# Patient Record
Sex: Female | Born: 1960 | Race: White | Hispanic: No | Marital: Married | Smoking: Never smoker
Health system: Southern US, Community
[De-identification: ages and names within clinical notes are randomized; demographics above are authoritative.]

## PROBLEM LIST (undated history)

## (undated) DIAGNOSIS — R7303 Prediabetes: Secondary | ICD-10-CM

## (undated) DIAGNOSIS — Z8041 Family history of malignant neoplasm of ovary: Secondary | ICD-10-CM

## (undated) DIAGNOSIS — Z90710 Acquired absence of both cervix and uterus: Secondary | ICD-10-CM

## (undated) DIAGNOSIS — R6882 Decreased libido: Secondary | ICD-10-CM

## (undated) DIAGNOSIS — M84374A Stress fracture, right foot, initial encounter for fracture: Secondary | ICD-10-CM

## (undated) DIAGNOSIS — B019 Varicella without complication: Secondary | ICD-10-CM

## (undated) DIAGNOSIS — N393 Stress incontinence (female) (male): Secondary | ICD-10-CM

## (undated) DIAGNOSIS — N764 Abscess of vulva: Secondary | ICD-10-CM

## (undated) DIAGNOSIS — E8940 Asymptomatic postprocedural ovarian failure: Secondary | ICD-10-CM

## (undated) HISTORY — DX: Decreased libido: R68.82

## (undated) HISTORY — PX: SHOULDER SURGERY: SHX246

## (undated) HISTORY — DX: Abscess of vulva: N76.4

## (undated) HISTORY — DX: Stress fracture, right foot, initial encounter for fracture: M84.374A

## (undated) HISTORY — PX: KNEE SURGERY: SHX244

## (undated) HISTORY — DX: Acquired absence of both cervix and uterus: Z90.710

## (undated) HISTORY — DX: Prediabetes: R73.03

## (undated) HISTORY — DX: Varicella without complication: B01.9

## (undated) HISTORY — DX: Stress incontinence (female) (male): N39.3

## (undated) HISTORY — DX: Asymptomatic postprocedural ovarian failure: E89.40

## (undated) HISTORY — PX: BILATERAL SALPINGOOPHORECTOMY: SHX1223

## (undated) HISTORY — DX: Family history of malignant neoplasm of ovary: Z80.41

---

## 1992-08-27 HISTORY — PX: ABDOMINAL HYSTERECTOMY: SHX81

## 2006-06-18 ENCOUNTER — Ambulatory Visit: Payer: Self-pay | Admitting: Unknown Physician Specialty

## 2006-08-29 ENCOUNTER — Ambulatory Visit: Payer: Self-pay | Admitting: Unknown Physician Specialty

## 2007-10-13 ENCOUNTER — Ambulatory Visit: Payer: Self-pay | Admitting: Chiropractic Medicine

## 2014-06-14 LAB — HM MAMMOGRAPHY

## 2014-09-10 DIAGNOSIS — N764 Abscess of vulva: Secondary | ICD-10-CM

## 2014-09-10 HISTORY — DX: Abscess of vulva: N76.4

## 2015-04-05 ENCOUNTER — Ambulatory Visit (INDEPENDENT_AMBULATORY_CARE_PROVIDER_SITE_OTHER): Payer: BC Managed Care – PPO | Admitting: Podiatry

## 2015-04-05 ENCOUNTER — Encounter: Payer: Self-pay | Admitting: Podiatry

## 2015-04-05 ENCOUNTER — Ambulatory Visit (INDEPENDENT_AMBULATORY_CARE_PROVIDER_SITE_OTHER): Payer: BC Managed Care – PPO

## 2015-04-05 VITALS — BP 107/68 | HR 66 | Resp 16

## 2015-04-05 DIAGNOSIS — M779 Enthesopathy, unspecified: Secondary | ICD-10-CM | POA: Diagnosis not present

## 2015-04-05 DIAGNOSIS — M79671 Pain in right foot: Secondary | ICD-10-CM

## 2015-04-05 DIAGNOSIS — M2580 Other specified joint disorders, unspecified joint: Secondary | ICD-10-CM

## 2015-04-05 NOTE — Progress Notes (Signed)
   Subjective:    Patient ID: Christy Conway, female    DOB: 09/06/1960, 54 y.o.   MRN: [id number]  HPI 54 year old female presents the assistance of the right foot pain in the ball of her feet been ongoing for the last several weeks. She denies a history of injury or trauma to the area. She denies any swelling and redness. She gets an occasional burning sensation to the toes after these on for quite some time. She is on the pain to the ball of her foot particularly in the first big toe joint. She also points the medial aspect of the first metatarsal head where she gets some discomfort.  She does have to walk on the outside portion of her foot to take pressure off the inside part of her foot. She has no pain at rest however she has pain after standing for quite some time.she's had no prior treatment. No other complaints at this time.   Review of Systems  All other systems reviewed and are negative.      Objective:   Physical Exam AAO x3, NAD DP/PT pulses palpable bilaterally, CRT less than 3 seconds Protective sensation intact with Simms Weinstein monofilament, vibratory sensation intact, Achilles tendon reflex intact There is tenderness to palpation on the medial band of the plantar fascia distally just proximal to the sesamoids. There is mild discomfort to palpation around the sesamoid complex as well. There is no pain the vibratory sensation. There is no pain with MTPJ range of motion. There is no palpable neuroma identified the interspaces. There is no reproduction numbness or tingling upon compression the interspaces or with meals lateral compression the metatarsals. There is no area pinpoint bony tenderness or pain the vibratory sensation of bilateral lower extremity. Hammertoe contractures are present. MMT 5/5, ROM WNL.  No open lesions or pre-ulcerative lesions.  No overlying edema, erythema, increase in warmth to bilateral lower extremities.  No pain with calf compression, swelling,  warmth, erythema bilaterally.      Assessment & Plan:  54 year old female with capsulitis sesamoiditis left foot, tendinitis -X-rays were obtained and reviewed with the patient. There does appear to be some chronic changes of the fibular sesamoid. However there is no history of injury, so fracture and acute sort is unlikely. -Treatment options discussed including all alternatives, risks, and complications -Discussed likely etiology of her symptoms. -I Discussed steroid injection from the sesamoid complex and just proximal to the area of maximal tenderness. Discussed risks complications the injection for which she understands improvement in symptoms. Under sterile conditions a total of 1 mL mixture of dexamethasone phosphate and 0.5% Marcaine plain was infiltrated just proximal to the sesamoid complex the area of maximal tenderness. Bandage was applied. She tolerated the injection well without any complications. Post injection care was discussed the patient. -Dispensed offloading pads. -Continue anti-inflammatories. She does get her prescription filled for ibuprofen 800 mg. -Follow-up 2-3 weeks or sooner if any problems arise. In the meantime, encouraged to call the office with any questions, concerns, change in symptoms.   Ovid Curd, DPM

## 2015-04-12 ENCOUNTER — Encounter: Payer: Self-pay | Admitting: Obstetrics and Gynecology

## 2015-04-12 ENCOUNTER — Ambulatory Visit (INDEPENDENT_AMBULATORY_CARE_PROVIDER_SITE_OTHER): Payer: BC Managed Care – PPO | Admitting: Obstetrics and Gynecology

## 2015-04-12 VITALS — BP 113/70 | HR 67 | Ht 64.0 in | Wt 133.4 lb

## 2015-04-12 DIAGNOSIS — E8940 Asymptomatic postprocedural ovarian failure: Secondary | ICD-10-CM | POA: Insufficient documentation

## 2015-04-12 DIAGNOSIS — N898 Other specified noninflammatory disorders of vagina: Secondary | ICD-10-CM

## 2015-04-12 MED ORDER — ESTRADIOL 0.1 MG/GM VA CREA: TOPICAL_CREAM | VAGINAL | Status: DC

## 2015-04-12 MED ORDER — FLUCONAZOLE 150 MG PO TABS: 150.0000 mg | ORAL_TABLET | Freq: Once | ORAL | Status: DC

## 2015-04-12 NOTE — Patient Instructions (Signed)
1.  Diflucan 150 mg by mouth 2.  Estrace cream 1/2-1 g intravaginally biweekly 3.  Return in 2 months for follow-up

## 2015-04-12 NOTE — Progress Notes (Signed)
Patient ID: Christy Conway, female   DOB: [birth year], 54 y.o.   MRN: [id number] Vaginal dryness getting worse this last 2 months No estrogen cream used  Chief complaint: 1.  Vaginal dryness  54 year old white female with history of surgical menopause, on Estratest, without vasomotor symptoms, presents for evaluation of worsening vaginal dryness over the past 2 months.  She denies vaginal itching; she has had a white mucoid discharge.  No recent antibiotic therapy.  OBJECTIVE: BP 113/70 mmHg  Pulse 67  Ht  (1.626 m)  Wt 133 lb 6.4 oz (60.51 kg)  BMI 22.89 kg/m2  LMP  (LMP Unknown) Pleasant, well-appearing white female in no acute distress. Abdomen: Soft, nontender. Pelvic exam:  External genitalia: Normal   BUS: Normal.  Vagina: Good vaginal vault support; no lesions; copious white mucoid discharge present.  Cervix: Surgically absent   Uterus: Surgically absent  Bimanual exam: Deferred.  IMPRESSION: 1.  Recent onset worsening vaginal dryness, unclear etiology. 2.  Vagina with normal estrogen effect. 3.  White mucoid discharge  PLAN: 1.  Nu swab for Candida and BV 2.  Diflucan 150 mg by mouth 3.  Begin Estrace cream 1/2-1 g intravaginal biweekly. 4.  Return in 2 months for follow-up.  A total of 15 minutes were spent face-to-face with the patient during this encounter and over half of that time dealt with counseling and coordination of care.

## 2015-04-16 LAB — NUSWAB BV AND CANDIDA, NAA
Candida albicans, NAA: NEGATIVE
Candida glabrata, NAA: NEGATIVE

## 2015-04-19 ENCOUNTER — Ambulatory Visit (INDEPENDENT_AMBULATORY_CARE_PROVIDER_SITE_OTHER): Payer: BC Managed Care – PPO | Admitting: Podiatry

## 2015-04-19 ENCOUNTER — Encounter: Payer: Self-pay | Admitting: Podiatry

## 2015-04-19 ENCOUNTER — Ambulatory Visit (INDEPENDENT_AMBULATORY_CARE_PROVIDER_SITE_OTHER): Payer: BC Managed Care – PPO

## 2015-04-19 VITALS — BP 105/74 | HR 72 | Resp 18

## 2015-04-19 DIAGNOSIS — M879 Osteonecrosis, unspecified: Secondary | ICD-10-CM | POA: Diagnosis not present

## 2015-04-19 DIAGNOSIS — M2580 Other specified joint disorders, unspecified joint: Secondary | ICD-10-CM | POA: Diagnosis not present

## 2015-04-19 DIAGNOSIS — M8700 Idiopathic aseptic necrosis of unspecified bone: Secondary | ICD-10-CM

## 2015-04-19 DIAGNOSIS — M216X1 Other acquired deformities of right foot: Secondary | ICD-10-CM | POA: Diagnosis not present

## 2015-04-20 NOTE — Progress Notes (Signed)
Patient ID: Christy Conway, female   DOB: May 21, 1961, 54 y.o.   MRN: [id number]  Subjective: 54 year old female presents the office for follow-up evaluation of right foot pain. She states that she can she have pain to her right foot in the ball of her foot. She says the injection did not help much and she's been taking ibuprofen. She states her pain is about the same. No other complaints at this time.  Objective: AAO x3, NAD DP/PT pulses palpable bilaterally, CRT less than 3 seconds Protective sensation intact with Simms Weinstein monofilament On the right foot there is tenderness palpation underlying the first interspace on the lateral sesamoid. There is also mild tenderness along submetatarsal 3. There is no tenderness on the dorsal aspect of the metatarsals or digits. Mild discomfort with first and third MPJ range of motion however there is no crepitation or limitation of motion. There is no overlying edema, erythema, increase in warmth. There is no palpable neuroma identified at this time. No other areas of tenderness to bilateral lower extremities. MMT 5/5, ROM WNL.  No open lesions or pre-ulcerative lesions.  No overlying edema, erythema, increase in warmth to bilateral lower extremities.  No pain with calf compression, swelling, warmth, erythema bilaterally.   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 There appears to be more fragmentation of the lateral sesamoid compared to prior x-ray. Also in the sesamoid axial the third metatarsal appears to be somewhat more plantar flexed compared to the other metatarsals. -Because the above findings of the sesamoid abdomen ordered an MRI to further evaluate this area. -She is unable to be immobilized that she is going back to work next week for which she works in [employer]. I dispensed a graphite insert to mobilize the  area help take pressure off the MPJs. Also dispensed metatarsal offloading pads. -Anti-inflammatories as needed. -Discussed to wear supportive shoe at all times. -Follow-up after MRI or sooner if any problems arise. In the meantime, encouraged to call the office with any questions, concerns, change in symptoms.   Ovid Curd, DPM

## 2015-04-26 ENCOUNTER — Telehealth: Payer: Self-pay | Admitting: *Deleted

## 2015-04-26 NOTE — Telephone Encounter (Signed)
Prior authorization received from [facility] at 117pm dx M87.9 vs M25.80 for MRI 40981, Prior Authorization# [id number] valid 05/25/2015.  Informed pt she had received prior authorization and could schedule her MRI.

## 2015-05-03 ENCOUNTER — Ambulatory Visit: Payer: BC Managed Care – PPO | Admitting: Podiatry

## 2015-05-04 ENCOUNTER — Ambulatory Visit
Admission: RE | Admit: 2015-05-04 | Discharge: 2015-05-04 | Disposition: A | Discharge status: Home / Self Care | Payer: BC Managed Care – PPO | Source: Ambulatory Visit | Attending: Podiatry | Admitting: Podiatry

## 2015-05-04 DIAGNOSIS — M25871 Other specified joint disorders, right ankle and foot: Secondary | ICD-10-CM | POA: Diagnosis present

## 2015-05-04 DIAGNOSIS — M25474 Effusion, right foot: Secondary | ICD-10-CM | POA: Diagnosis not present

## 2015-05-04 DIAGNOSIS — M879 Osteonecrosis, unspecified: Secondary | ICD-10-CM | POA: Insufficient documentation

## 2015-05-04 DIAGNOSIS — M8700 Idiopathic aseptic necrosis of unspecified bone: Secondary | ICD-10-CM

## 2015-05-04 DIAGNOSIS — M2580 Other specified joint disorders, unspecified joint: Secondary | ICD-10-CM

## 2015-05-05 ENCOUNTER — Telehealth: Payer: Self-pay | Admitting: *Deleted

## 2015-05-05 NOTE — Telephone Encounter (Addendum)
-----   Message from Vivi Barrack, DPM sent at 05/05/2015  8:09 AM EDT ----- Can we send her MRI out for an over read? She has sesamoid pain and I am concerned about AVN. Also, can you let her know we got the results but I am sending it out for a second opinion? Thanks. Faxed request for copy of MRI CD to [facility].  Left message pt's mobile to call our office for information concerning MRI results, left message on home phone that the MRI was sent out for a more in depth reading and we would call with results.

## 2015-05-12 ENCOUNTER — Encounter: Payer: Self-pay | Admitting: Podiatry

## 2015-05-12 ENCOUNTER — Ambulatory Visit (INDEPENDENT_AMBULATORY_CARE_PROVIDER_SITE_OTHER): Payer: BC Managed Care – PPO | Admitting: Podiatry

## 2015-05-12 VITALS — BP 121/76 | HR 61 | Resp 16

## 2015-05-12 DIAGNOSIS — M2580 Other specified joint disorders, unspecified joint: Secondary | ICD-10-CM | POA: Diagnosis not present

## 2015-05-12 NOTE — Progress Notes (Signed)
Patient ID: Christy Conway, female   DOB: 12/13/1960, 54 y.o.   MRN: [id number]  Subjective: 54 year old female presents the office a follow up evaluation of right foot pain, sesamoid pain. She presents today for MRI results. She states that she continues with the same pain is not changed. Anti-inflammatories and injections do not seem to do much. She denies any swelling or redness. No recent injury or trauma. No other complaints at this time in no acute changes otherwise.  Objective: AAO 3, NAD Neurovascular status intact and unchanged. Discontinuation of pain along the plantar aspect of the sesamoid on the lateral sesamoid. There is no pain with MPJ range of motion or along the metatarsals. There is no pain along the first interspace. There is no other areas of pinpoint bony tenderness or pain the vibratory sensation. There is no overlying edema, erythema, increase in warmth. No open lesions or pre-ulcerative lesions. There is no pain with calf compression, swelling, warmth, erythema.  Assessment: 54 year old female sesamoiditis lateral sesamoid right foot  Plan: -MRI results were discussed the patient. MRI did not reveal much as far as AVN or osteoarthritis images comments on sesamoiditis. I sent the  MRI for second opinion. -If there is concentric AVN will immobilize and possible bone sooner. If there is inflammation and osteoarthritis likely start Medrol Dosepak. Either way and discussed that she'll likely benefit from custom molded orthotics and she stands on her feet all day. For now continue offloading pad. I will contact her once he received the results of the over read. In the meantime call the office with any questions, concerns, changes symptoms.  Ovid Curd, DPM

## 2015-05-19 ENCOUNTER — Ambulatory Visit (INDEPENDENT_AMBULATORY_CARE_PROVIDER_SITE_OTHER): Payer: BC Managed Care – PPO | Admitting: Podiatry

## 2015-05-19 ENCOUNTER — Ambulatory Visit: Payer: BC Managed Care – PPO | Admitting: Podiatry

## 2015-05-19 ENCOUNTER — Encounter: Payer: Self-pay | Admitting: Podiatry

## 2015-05-19 VITALS — BP 123/58 | HR 62 | Resp 18

## 2015-05-19 DIAGNOSIS — T148 Other injury of unspecified body region: Secondary | ICD-10-CM | POA: Diagnosis not present

## 2015-05-19 DIAGNOSIS — IMO0002 Reserved for concepts with insufficient information to code with codable children: Secondary | ICD-10-CM

## 2015-05-19 DIAGNOSIS — M2580 Other specified joint disorders, unspecified joint: Secondary | ICD-10-CM

## 2015-05-20 NOTE — Progress Notes (Signed)
Patient ID: Christy Conway, female   DOB: February 11, 1961, 54 y.o.   MRN: [id number]  Subjective: 54 year old female presents the office a follow up evaluation of right foot pain, sesamoid pain. She presents today to discuss the MRI results. She states that she continues to have pain to the ball of her right foot and she occasionally gets some numbness and tingling to her second, third toes. She states that she had increased pain today after standing all day at work. She denies any swelling or redness. No recent injury or trauma. No other complaints at this time in no acute changes. She denies any systemic complaints such as fevers, chills, nausea, vomiting.  Objective: AAO 3, NAD Neurovascular status intact and unchanged. There is continued tenderness to palpation overlying the right foot on the both the medial and lateral sesamoid mostly of on the lateral sesamoid. There is mild edema overlying the area without any associated erythema or increase in warmth. There is no pain with MPJ range of motion. There is no tenderness of the digit or the metatarsal. There is mild discomfort upon palpation of the second interspace. There is subjective numbness and tingling to the second, third toes. No palpable neuromas identified. No other areas of tenderness to bilateral lower extremities. No other areas of edema, erythema, increased warmth. There is no pain with calf compression, swelling, warmth, erythema.  Assessment: 54 year old female lateral sesamoid fracture right foot, Morton's neuroma  Plan: -Treatment options discussed including all alternatives, risks, and complications -MRI results were discussed the patient. MRI does reveal fracture of the lateral sesamoid and inflammation within the tibial sesamoid. Also shows Morton's neuroma in the second interspace. -Due to the fracture and the continued pain recommend offloading. Dispensed surgical shoe with offloading pads to take pressure off the sesamoids. She  states that she cannot with so work and she must work. I did dispense offloading pads which were made for her to take pressure off the sesamoids. This may be a chronic fracture. She has no history of injury to the area this is been ongoing for quite some time. -Anti-inflammatories as needed for pain. -Ice and elevation. -Follow-up in 4 weeks or sooner if any problems arise. In the meantime, encouraged to call the office with any questions, concerns, change in symptoms.   Ovid Curd, DPM

## 2015-05-23 ENCOUNTER — Telehealth: Payer: Self-pay | Admitting: Podiatry

## 2015-05-23 NOTE — Telephone Encounter (Signed)
Patient called today saying that she is able to wear the boot/shoe during the day, and its helping with the pain. However, she wants to know if she takes a leave, would her being completely off her feet help this stress fracture to heal faster? Apparently she is unable to wear the shoe for the 6 hours that she works each day and wants to talk about other options. She is requesting a call back from the nurse, or Dr. Ardelle Anton himself. Please call patient at the [phone] as soon as possible. She already has an appointment scheduled for 05/24/15 but doesn't want to keep this appointment if he doesn't need to see her again.

## 2015-05-23 NOTE — Telephone Encounter (Signed)
I will have Victorino Dike call her. Thanks

## 2015-05-24 ENCOUNTER — Ambulatory Visit: Payer: BC Managed Care – PPO | Admitting: Podiatry

## 2015-05-24 ENCOUNTER — Telehealth: Payer: Self-pay

## 2015-05-24 NOTE — Telephone Encounter (Signed)
Pt had called 05/23/2015 asking if she would stay off of her foot would it heal sooner then if she kept working . Dr Ardelle Anton said he would approve FMLA paperwork if she would bring it in to the office. Aslo, there was no need for her to keep her appt for 05/24/2015 per Dr. Ardelle Anton.  Pt will bring in [facility] paperwork.

## 2015-05-25 DIAGNOSIS — M79673 Pain in unspecified foot: Secondary | ICD-10-CM

## 2015-05-27 ENCOUNTER — Encounter: Payer: Self-pay | Admitting: Podiatry

## 2015-05-30 ENCOUNTER — Telehealth: Payer: Self-pay | Admitting: Podiatry

## 2015-05-30 NOTE — Telephone Encounter (Signed)
Patient stopped by the office asking about the status of her FMLA / Disability paperwork. Please call her.

## 2015-06-02 ENCOUNTER — Encounter: Payer: Self-pay | Admitting: Podiatry

## 2015-06-08 NOTE — Telephone Encounter (Signed)
CALLED PATIENT ALREADY. Christy Conway

## 2015-06-08 NOTE — Telephone Encounter (Signed)
CALLED PATIENT ON 05/30/15 AND STATED THAT I WAS WORKING ON THE FMLA PAPERS AND IT WAS GOING TO BE Tuesday WHEN SHE COULD COME DUE TO ME (Arlita) BEING IN [place] ON Monday. Anselma

## 2015-06-16 ENCOUNTER — Encounter: Payer: Self-pay | Admitting: Podiatry

## 2015-06-16 ENCOUNTER — Ambulatory Visit: Payer: BC Managed Care – PPO | Admitting: Podiatry

## 2015-06-16 ENCOUNTER — Ambulatory Visit (INDEPENDENT_AMBULATORY_CARE_PROVIDER_SITE_OTHER): Payer: BC Managed Care – PPO

## 2015-06-16 ENCOUNTER — Ambulatory Visit (INDEPENDENT_AMBULATORY_CARE_PROVIDER_SITE_OTHER): Payer: BC Managed Care – PPO | Admitting: Podiatry

## 2015-06-16 VITALS — BP 110/70 | HR 76 | Resp 12

## 2015-06-16 DIAGNOSIS — IMO0002 Reserved for concepts with insufficient information to code with codable children: Secondary | ICD-10-CM

## 2015-06-16 DIAGNOSIS — T148 Other injury of unspecified body region: Secondary | ICD-10-CM | POA: Diagnosis not present

## 2015-06-16 NOTE — Progress Notes (Signed)
Patient ID: Christy DresserLisa K Conway, female   DOB: Sep 03, 1960, 54 y.o.   MRN: [id number]  Subjective: Patient presents the office in follow-up evaluation of left sesamoid fracture, neuroma. She's continue the surgical shoe. She states her pain is about the same although slightly improved. She does that she had a wedding for her son and she wore high-heeled shoe which increased her pain although since returning the surgical shoe she was doing better. She denies any swelling or redness around the area. No recent injury or trauma. No other complaints at this time in no acute changes.  Objective: AAO 3, NAD DP/PT pulses palpable 2/4, CRT less than 3 seconds Protective sensation intact with Simms Weinstein monofilament There is continued tenderness palpation of the lateral sesamoid on the plantar aspect of the left foot. There is no other specific area pinpoint bony tenderness or pain the vibratory sensation bilateral lower extremity. There is no overlying edema, erythema, increase in warmth bilaterally. There is no pain with MPJ range of motion.MMT 5/5, ROM WNL. No open lesions or pre-ulcerative lesions identified bilaterally. No pain with calf compression, swelling, warmth, erythema.  Assessment: 54 year old female right foot sesamoid fracture  Plan: -X-rays were obtained and reviewed with the patient.  -Treatment options discussed including all alternatives, risks, and complications -Recommend continued immobilization in the surgical shoe with offloading pads.  -Ice and elevation. -I discussed the possibility of a bone stimulator once her insurance approves this. We will discuss this and next appointment. *xray next appointment  Ovid CurdMatthew Mkenzie Dotts, DPM

## 2015-06-28 ENCOUNTER — Encounter: Payer: Self-pay | Admitting: Obstetrics and Gynecology

## 2015-06-28 ENCOUNTER — Ambulatory Visit (INDEPENDENT_AMBULATORY_CARE_PROVIDER_SITE_OTHER): Payer: BC Managed Care – PPO | Admitting: Obstetrics and Gynecology

## 2015-06-28 VITALS — BP 147/82 | HR 72 | Ht 64.0 in | Wt 132.2 lb

## 2015-06-28 DIAGNOSIS — N958 Other specified menopausal and perimenopausal disorders: Secondary | ICD-10-CM | POA: Diagnosis not present

## 2015-06-28 DIAGNOSIS — Z01419 Encounter for gynecological examination (general) (routine) without abnormal findings: Secondary | ICD-10-CM

## 2015-06-28 DIAGNOSIS — E8940 Asymptomatic postprocedural ovarian failure: Secondary | ICD-10-CM

## 2015-06-28 DIAGNOSIS — Z1211 Encounter for screening for malignant neoplasm of colon: Secondary | ICD-10-CM | POA: Diagnosis not present

## 2015-06-28 DIAGNOSIS — R6882 Decreased libido: Secondary | ICD-10-CM

## 2015-06-28 DIAGNOSIS — Z90710 Acquired absence of both cervix and uterus: Secondary | ICD-10-CM | POA: Insufficient documentation

## 2015-06-28 DIAGNOSIS — Z8041 Family history of malignant neoplasm of ovary: Secondary | ICD-10-CM | POA: Diagnosis not present

## 2015-06-28 MED ORDER — IBUPROFEN 800 MG PO TABS: 800.0000 mg | ORAL_TABLET | Freq: Three times a day (TID) | ORAL | Status: DC | PRN

## 2015-06-28 MED ORDER — EST ESTROGENS-METHYLTEST 1.25-2.5 MG PO TABS: 1.0000 | ORAL_TABLET | Freq: Every day | ORAL | Status: DC

## 2015-06-28 MED ORDER — ESTRADIOL 10 MCG VA TABS: 10.0000 ug | ORAL_TABLET | VAGINAL | Status: DC

## 2015-06-28 NOTE — Progress Notes (Signed)
Patient ID: Christy Conway, female   DOB: 1961/08/07, 54 y.o.   MRN: [id number] ANNUAL PREVENTATIVE CARE GYN  ENCOUNTER NOTE  Subjective:       Christy Conway is a 54 y.o. G35P2003  female here for a routine annual gynecologic exam.  Current complaints: 1.  Vaginal dryness- doesn't like using the cream 2.  Ibuprofen 800 mg refill for back pain 3.  Uncertain about continuing HRT   Gynecologic History No LMP recorded (lmp unknown). Patient has had a hysterectomy. Contraception: status post hysterectomy, TVH;Laparoscopic BSO, on HRT Last Pap: not needed. Results were: normal Last mammogram: 06/20/2015. Results were: normal 1 SVD, 1 C section twins  Obstetric History OB History  No data available    Past Medical History  Diagnosis Date  . Decreased libido   . Surgical menopause   . 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 Prediabetes   . SUI (stress urinary incontinence, female)   . Family history of ovarian cancer   . Status post vaginal hysterectomy   . Stress fracture of right foot     Past Surgical History  Procedure Laterality Date  . Bilateral salpingoophorectomy      and adhesiolysis  . Shoulder surgery Right   . Knee surgery    . Cesarean section  1991 twins  . Abdominal hysterectomy  1994    transvaginal- cak    Current Outpatient Prescriptions on File Prior to Visit  Medication Sig Dispense Refill  . calcium-vitamin D 250-100 MG-UNIT per tablet Take 1 tablet by mouth 2 (two) times daily.    Marland Kitchen estrogen-methylTESTOSTERone (ESTRATEST) 1.25-2.5 MG per tablet Take 1 tablet by mouth daily.    Marland Kitchen ibuprofen (ADVIL,MOTRIN) 800 MG tablet Take 800 mg by mouth every 8 (eight) hours as needed.     No current facility-administered medications on file prior to visit.    No Known Allergies  Social History   Social History  . Marital Status: Married    Spouse Name: N/A  . Number of Children: N/A  . Years of Education: N/A    Occupational History  . Not on file.   Social History Main Topics  . Smoking status: Never Smoker   . Smokeless tobacco: Never Used  . Alcohol Use: No  . Drug Use: No  . Sexual Activity: Yes    Birth Control/ Protection: Surgical   Other Topics Concern  . Not on file   Social History Narrative    Family History  Problem Relation Age of Onset  . Ovarian cancer Mother 33  . Colon cancer Neg Hx   . Breast cancer Neg Hx   . Diabetes Neg Hx   . Heart disease Maternal Grandmother   . Heart disease Maternal Grandfather     The following portions of the patient's history were reviewed and updated as appropriate: allergies, current medications, past family history, past medical history, past social history, past surgical history and problem list.  Review of Systems ROS Review of Systems - General ROS: negative for - chills, fatigue, fever, hot flashes, night sweats, weight gain or weight loss Psychological ROS: negative for - anxiety, decreased libido, depression, mood swings, physical abuse or sexual abuse Ophthalmic ROS: negative for - blurry vision, eye pain or loss of vision ENT ROS: negative for - headaches, hearing change, visual changes or vocal changes Allergy and Immunology ROS: negative for - hives, itchy/watery eyes or seasonal allergies Hematological and Lymphatic ROS: negative  for - bleeding problems, bruising, swollen lymph nodes or weight loss Endocrine ROS: negative for - galactorrhea, hair pattern changes, hot flashes, malaise/lethargy, mood swings, palpitations, polydipsia/polyuria, skin changes, temperature intolerance or unexpected weight changes Breast ROS: negative for - new or changing breast lumps or nipple discharge Respiratory ROS: negative for - cough or shortness of breath Cardiovascular ROS: negative for - chest pain, irregular heartbeat, palpitations or shortness of breath Gastrointestinal ROS: no abdominal pain, change in bowel habits, or black or  bloody stools Genito-Urinary ROS: no dysuria, trouble voiding, or hematuria Musculoskeletal ROS: negative for - joint pain or joint stiffness Neurological ROS: negative for - bowel and bladder control changes Dermatological ROS: negative for rash and skin lesion changes   Objective:   BP 147/82 mmHg  Pulse 72  Ht 5\' 4"  (1.626 m)  Wt 132 lb 3.4 oz (59.97 kg)  BMI 22.68 kg/m2  LMP  (LMP Unknown) CONSTITUTIONAL: Well-developed, well-nourished female in no acute distress.  PSYCHIATRIC: Normal mood and affect. Normal behavior. Normal judgment and thought content. NEUROLGIC: Alert and oriented to person, place, and time. Normal muscle tone coordination. No cranial nerve deficit noted. HENT:  Normocephalic, atraumatic, External right and left ear normal. Oropharynx is clear and moist EYES: Conjunctivae and EOM are normal. Pupils are equal, round, and reactive to light. No scleral icterus.  NECK: Normal range of motion, supple, no masses.  Normal thyroid.  SKIN: Skin is warm and dry. No rash noted. Not diaphoretic. No erythema. No pallor. CARDIOVASCULAR: Normal heart rate noted, regular rhythm, no murmur. RESPIRATORY: Clear to auscultation bilaterally. Effort and breath sounds normal, no problems with respiration noted. BREASTS: Symmetric in size. No masses, skin changes, nipple drainage, or lymphadenopathy. ABDOMEN: Soft, normal bowel sounds, no distention noted.  No tenderness, rebound or guarding.  BLADDER: Normal PELVIC:  External Genitalia: Normal  BUS: Normal  Vagina: Normal; Moderate milky secretions in vaginal vault  Cervix: Surgically absent  Uterus: Surgically absent  Adnexa: Normal  RV: External Exam NormaI, No Rectal Masses and Normal Sphincter tone  MUSCULOSKELETAL: Normal range of motion. No tenderness.  No cyanosis, clubbing, or edema.  2+ distal pulses. LYMPHATIC: No Axillary, Supraclavicular, or Inguinal Adenopathy.    Assessment:   Annual gynecologic examination 54  y.o. Contraception: status post hysterectomy, TVH;Laparoscopic BSO Family history of ovarian cancer (mother) Normal BMI   Plan:  Pap: Not needed Mammogram: utd Stool Guaiac Testing:  Ordered Labs: lipid vit d fbs a1c tsh Routine preventative health maintenance measures emphasized: Exercise/Diet/Weight control, Tobacco Warnings and Alcohol/Substance use risks Trial of Vagifem biweekly Return to Clinic - 1 Year   Crystal Fairfield UniversityMiller, CMA  Herold HarmsMartin A Joycelyn Liska, MD  Note: This dictation was prepared with Dragon dictation along with smaller phrase technology. Any transcriptional errors that result from this process are unintentional.

## 2015-06-28 NOTE — Patient Instructions (Signed)
1.  No Pap needed. 2.  Mammogram already done-normal. 3.  Stool guaiac cards given. 4.  Screening blood work ordered. 5.  Estratest is refilled. 6.  Trial of Vagifem intravaginal biweekly started. 7.  Return in 1 year

## 2015-06-30 ENCOUNTER — Encounter: Payer: Self-pay | Admitting: Podiatry

## 2015-06-30 ENCOUNTER — Ambulatory Visit (INDEPENDENT_AMBULATORY_CARE_PROVIDER_SITE_OTHER): Payer: BC Managed Care – PPO

## 2015-06-30 ENCOUNTER — Ambulatory Visit (INDEPENDENT_AMBULATORY_CARE_PROVIDER_SITE_OTHER): Payer: BC Managed Care – PPO | Admitting: Podiatry

## 2015-06-30 VITALS — BP 120/77 | HR 68 | Resp 18

## 2015-06-30 DIAGNOSIS — R52 Pain, unspecified: Secondary | ICD-10-CM

## 2015-06-30 DIAGNOSIS — M2580 Other specified joint disorders, unspecified joint: Secondary | ICD-10-CM

## 2015-06-30 DIAGNOSIS — M779 Enthesopathy, unspecified: Secondary | ICD-10-CM

## 2015-06-30 NOTE — Progress Notes (Signed)
Patient ID: Buford DresserLisa K Lupa, female   DOB: 28-Jun-1961, 54 y.o.   MRN: [id number]  Subjective: Patient presents the office in follow-up evaluation of left sesamoid fracture, neuroma.  Since that she's been continuing with a surgical shoe at all times. She states that she was doing well up until Saturday and she's not having any pain to her foot. On Saturday she was doing quite a bit of bending /squatting and she did  reinjure the right foot and since then she's had pain along the same area. She denies any increase in swelling or redness to the area. No other complaints at this time.  Objective: AAO 3, NAD DP/PT pulses palpable 2/4, CRT less than 3 seconds Protective sensation intact with Simms Weinstein monofilament  there is continued mild tenderness to palpation of the medial and lateral sesamoids of the left foot. There is no pain with MPJ range of motion. The majority the pain today seems to be localized to the medial band of the plantar  Fascia along the distal aspect within the arch of the foot. The plantar fascia appears to be intact. There is no other area pinpoint bony tenderness to bilateral lower extremity. There is no open edema, erythema, increase in warmth. There is no pain with calf compression, swelling, warmth, erythema. No open lesions or pre-ulcer lesions identified bilaterally.  There is no pain or reproduction of symptoms within palpation of the interspaces. There is no palpable neuroma identified and this time there is no clicking sensation.  Assessment: 54 year old female right foot sesamoid fracture,   Plan: -X-rays were obtained and reviewed with the patient.  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 -Treatment options discussed including all alternatives, risks, and complications - at this time continue with surgical shoe. - I did discuss stretching exercises for plantar fasciitis. Excision increasing pain to the sesamoid area to  hold off on exercises. Also I discussed the possible return to a supportive sneaker as tolerated. However before proceeding with the return a sneaker outlet to get her into an orthotic with a good offloading of the sesamoids the right side. She was scanned for orthotics today and [facility] Into these orthotics arrive continue in the surgical shoe.  I tried ordering a bone stimulator however we are not yet at her 90 days. -Ice and elevation. -Follow-up as scheduled or sooner if any problems arise. In the meantime, encouraged to call the office with any questions, concerns, change in symptoms.   *xray next appointment  Ovid CurdMatthew Wagoner, DPM

## 2015-06-30 NOTE — Patient Instructions (Signed)

## 2015-07-11 ENCOUNTER — Telehealth: Payer: Self-pay | Admitting: Podiatry

## 2015-07-11 NOTE — Telephone Encounter (Signed)
patient called asking status of her last disability claim form being sent in. This is one that you changed the return to work date on to extend it to August 29, 2015. Paperwork [place] to [place] on 06/30/15.    By Erik Obeyammy R Honeycutt

## 2015-07-12 MED ORDER — ESTRADIOL 10 MCG VA TABS: 10.0000 ug | ORAL_TABLET | Freq: Every day | VAGINAL | Status: DC

## 2015-07-12 NOTE — Addendum Note (Signed)
Addended by: Marchelle FolksMILLER, Shahir Karen G on: 07/12/2015 02:43 PM   Modules accepted: Orders

## 2015-07-13 LAB — FECAL OCCULT BLOOD, IMMUNOCHEMICAL: FECAL OCCULT BLD: NEGATIVE

## 2015-07-14 ENCOUNTER — Telehealth: Payer: Self-pay

## 2015-07-14 MED ORDER — ESTRADIOL 10 MCG VA TABS: 10.0000 ug | ORAL_TABLET | Freq: Every day | VAGINAL | Status: DC

## 2015-07-14 NOTE — Telephone Encounter (Signed)
-----   Message from Herold HarmsMartin A Defrancesco, MD sent at 07/14/2015  7:43 AM EST ----- Please Notify - Labs normal

## 2015-07-14 NOTE — Telephone Encounter (Signed)
PT AWARE OF LABS- SENT IN A 90 DAY SUPPLY OF VAGIFEM FOR COST.

## 2015-07-19 ENCOUNTER — Encounter: Payer: Self-pay | Admitting: Podiatry

## 2015-07-19 ENCOUNTER — Ambulatory Visit (INDEPENDENT_AMBULATORY_CARE_PROVIDER_SITE_OTHER): Payer: BC Managed Care – PPO | Admitting: Podiatry

## 2015-07-19 ENCOUNTER — Ambulatory Visit (INDEPENDENT_AMBULATORY_CARE_PROVIDER_SITE_OTHER): Payer: BC Managed Care – PPO

## 2015-07-19 VITALS — BP 113/71 | HR 72 | Resp 18

## 2015-07-19 DIAGNOSIS — M2580 Other specified joint disorders, unspecified joint: Secondary | ICD-10-CM

## 2015-07-19 DIAGNOSIS — S92492K Other fracture of left great toe, subsequent encounter for fracture with nonunion: Secondary | ICD-10-CM | POA: Diagnosis not present

## 2015-07-19 DIAGNOSIS — R52 Pain, unspecified: Secondary | ICD-10-CM | POA: Diagnosis not present

## 2015-07-24 DIAGNOSIS — S92819A Other fracture of unspecified foot, initial encounter for closed fracture: Secondary | ICD-10-CM | POA: Insufficient documentation

## 2015-07-24 NOTE — Progress Notes (Signed)
Patient ID: Christy Conway, female   DOB: 22-Aug-1961, 54 y.o.   MRN: [id number]  Subjective: Patient presents the office in follow-up evaluation of left sesamoid fracture, neuroma.  She states that she is feeling much better than she was last appointment. She has continued the surgical shoe and offloading pads. She also presents with a pickup orthotics. She does continue gets some discomfort on the ball of the big toe joint  Over the site of the fracture. She stated the swelling has decreased. She denies any numbness or tingling to her toes. No other complaints at this time.  Objective: AAO 3, NAD DP/PT pulses palpable 2/4, CRT less than 3 seconds Protective sensation intact with Simms Weinstein monofilament There is continued, but improved, tenderness to palpation of the medial and lateral sesamoids of the left foot with the lateral worse than the medial. There is no pain with MPJ range of motion.  There is no pillar course the plantar fascia distally on the side where she is having pain last appointment. There is no overlying edema, erythema, increase in warmth. There is no other areas of tenderness to bilateral lower shoes. There is no palpable neuroma identified this time.  There is no other areas of pinpoint bony tenderness at this time. No  Open lesions or pre-ulcerative lesions. No pain with calf compression, swelling, warmth, erythema.  Assessment: 54 year old female right foot sesamoid fracture; nonunion of sesamoid fracture  Plan: -X-rays were obtained and reviewed with the patient.  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 This fracture was also present on x-rays noted 04/05/2015. -Treatment options discussed including all alternatives, risks, and complications - due to the continuation of the fracture site recommended a bone stimulator. She was contacted by Thurston Poundsrey, International aid/development workerbone stimulator representative.  - Orthotics were dispensed. They were made to  help take pressure off the sesamoids. It does not provide enough offloading we can send them back for modifications. Break in instructions were discussed the patient. - Hold off on high-impact exercising or movements. -Follow-up in 3-4 weeks or sooner if any problems arise. In the meantime, encouraged to call the office with any questions, concerns, change in symptoms.   Ovid CurdMatthew Maylie Ashton, DPM

## 2015-07-25 ENCOUNTER — Telehealth: Payer: Self-pay | Admitting: *Deleted

## 2015-07-25 NOTE — Telephone Encounter (Signed)
Left pt know that Dr. Ardelle AntonWagoner was working with the bone growth stimulator representative to get the equipment.

## 2015-07-25 NOTE — Telephone Encounter (Signed)
-----   Message from Vivi BarrackMatthew R Wagoner, DPM sent at 07/24/2015  6:26 PM EST ----- Regarding: RE: letter Can you please let him know the note is complete. Thanks.  ----- Message -----    From: Marissa NestleValery D O'Connell, RN    Sent: 07/20/2015   9:27 AM      To: Vivi BarrackMatthew R Wagoner, DPM Subject: letter                                         Dr. Bernita RaisinWagoner, Trey came by for clinicals of 07/19/2015 for non-union statements.  Happy Thanksgiving from [place].  Joya SanValery

## 2015-08-09 ENCOUNTER — Ambulatory Visit (INDEPENDENT_AMBULATORY_CARE_PROVIDER_SITE_OTHER): Payer: BC Managed Care – PPO | Admitting: Podiatry

## 2015-08-09 ENCOUNTER — Ambulatory Visit (INDEPENDENT_AMBULATORY_CARE_PROVIDER_SITE_OTHER): Payer: BC Managed Care – PPO

## 2015-08-09 ENCOUNTER — Encounter: Payer: Self-pay | Admitting: Podiatry

## 2015-08-09 DIAGNOSIS — M2580 Other specified joint disorders, unspecified joint: Secondary | ICD-10-CM

## 2015-08-09 DIAGNOSIS — S92492K Other fracture of left great toe, subsequent encounter for fracture with nonunion: Secondary | ICD-10-CM | POA: Diagnosis not present

## 2015-08-09 DIAGNOSIS — R52 Pain, unspecified: Secondary | ICD-10-CM | POA: Diagnosis not present

## 2015-08-10 NOTE — Progress Notes (Signed)
Patient ID: Buford DresserLisa K Blumenfeld, female   DOB: 1960/10/22, 54 y.o.   MRN: [id number]  Subjective: Patient presents the office in follow-up evaluation of left sesamoid fracture, neuroma.  Since last appointment she states that she has been wearing orthotics and she has had very minimal pain. She currently denies any pain SHE gets some pains after she stands for excess appear to time. She denies any swelling or redness. No tenderness. No other complaints at this time. She has continue with the bone stimulator.  Objective: AAO 3, NAD DP/PT pulses palpable 2/4, CRT less than 3 seconds Protective sensation intact with Simms Weinstein monofilament There is currently no tenderness to palpation along the medial and lateral sesamoids of the left foot. There is no pain with MPJ range of motion.  There is no pain along the course the plantar fascia distally.  There is no overlying edema, erythema, increase in warmth. There is no other areas of tenderness to bilateral lower shoes. There is no palpable neuroma identified this time.  There is no other areas of pinpoint bony tenderness at this time. No  Open lesions or pre-ulcerative lesions. No pain with calf compression, swelling, warmth, erythema.  Assessment: 54 year old female right foot sesamoid fracture; nonunion of sesamoid fracture; with greatly improved symptoms.   Plan: -X-rays were obtained and reviewed with the patient. There does appear to be some evidence of consolidation although nonunion is still present the lateral sesamoid. -Treatment options discussed including all alternatives, risks, and complications -Recommended continue with orthotics and supportive shoes all times. Do not barefoot. Continue with a cemented her for now.  - Hold off on high-impact exercising or movements. -Follow-up in 4 weeks or sooner if any problems arise. In the meantime, encouraged to call the office with any questions, concerns, change in symptoms.  -She can return to  work on 08/29/2015  Ovid CurdMatthew Romonda Parker, DPM

## 2015-08-16 ENCOUNTER — Ambulatory Visit: Payer: BC Managed Care – PPO | Admitting: Podiatry

## 2015-09-15 ENCOUNTER — Encounter: Payer: Self-pay | Admitting: Podiatry

## 2015-09-15 ENCOUNTER — Ambulatory Visit: Payer: BC Managed Care – PPO | Admitting: Podiatry

## 2015-09-15 ENCOUNTER — Ambulatory Visit (INDEPENDENT_AMBULATORY_CARE_PROVIDER_SITE_OTHER): Payer: BC Managed Care – PPO

## 2015-09-15 ENCOUNTER — Ambulatory Visit (INDEPENDENT_AMBULATORY_CARE_PROVIDER_SITE_OTHER): Payer: BC Managed Care – PPO | Admitting: Podiatry

## 2015-09-15 VITALS — BP 121/74 | HR 73 | Resp 18

## 2015-09-15 DIAGNOSIS — R52 Pain, unspecified: Secondary | ICD-10-CM

## 2015-09-15 DIAGNOSIS — S92492K Other fracture of left great toe, subsequent encounter for fracture with nonunion: Secondary | ICD-10-CM

## 2015-09-15 NOTE — Progress Notes (Signed)
Patient ID: Christy Conway, female   DOB: [birth year], 55 y.o.   MRN: [id number]  Subjective: Patient presents the office in follow-up evaluation of left sesamoid fracture, neuroma.   Since last appointment she states that she has been wearing orthotics and she has had very minimal pain. She has returned to work and she has not had any pain. She does continue to use the bone stimulator. She denies any swelling or redness. No tenderness. No other complaints at this time. She has continue with the bone stimulator. No other complaints at this time.  Objective: AAO 3, NAD DP/PT pulses palpable 2/4, CRT less than 3 seconds Protective sensation intact with Simms Weinstein monofilament There is currently no tenderness to palpation along the medial and lateral sesamoids of the left foot. There is no pain with MPJ range of motion.  There is no pain along the course the plantar fascia distally.  There is no overlying edema, erythema, increase in warmth. There is no other areas of tenderness to bilateral lower shoes. There is no palpable neuroma identified this time.  There is no other areas of pinpoint bony tenderness at this time. No open lesions or pre-ulcerative lesions. No pain with calf compression, swelling, warmth, erythema.  Assessment: 55 year old female right foot sesamoid fracture; nonunion of sesamoid fracture; with currently no symptoms.  Plan: -X-rays were obtained and reviewed with the patient. There does appear to be some evidence of consolidation although nonunion is still present the lateral sesamoid. -Treatment options discussed including all alternatives, risks, and complications -Recommended continue with orthotics and supportive shoes all times. Do not barefoot.  -She has stopped the bone stimulator for now.  -Can start to increase activity.  -Follow-up if symptoms worsen. In the meantime, encouraged to call the office with any questions, concerns, change in symptoms.  Ovid Curd, DPM

## 2016-01-04 ENCOUNTER — Other Ambulatory Visit: Payer: Self-pay | Admitting: Obstetrics and Gynecology

## 2016-01-05 ENCOUNTER — Ambulatory Visit (INDEPENDENT_AMBULATORY_CARE_PROVIDER_SITE_OTHER): Payer: BC Managed Care – PPO | Admitting: Podiatry

## 2016-01-05 ENCOUNTER — Ambulatory Visit (INDEPENDENT_AMBULATORY_CARE_PROVIDER_SITE_OTHER): Payer: BC Managed Care – PPO

## 2016-01-05 ENCOUNTER — Encounter: Payer: Self-pay | Admitting: Podiatry

## 2016-01-05 VITALS — BP 118/77 | HR 82 | Resp 12

## 2016-01-05 DIAGNOSIS — S92492K Other fracture of left great toe, subsequent encounter for fracture with nonunion: Secondary | ICD-10-CM

## 2016-01-05 DIAGNOSIS — T148 Other injury of unspecified body region: Secondary | ICD-10-CM

## 2016-01-05 DIAGNOSIS — M216X9 Other acquired deformities of unspecified foot: Secondary | ICD-10-CM | POA: Diagnosis not present

## 2016-01-05 DIAGNOSIS — M2580 Other specified joint disorders, unspecified joint: Secondary | ICD-10-CM

## 2016-01-05 DIAGNOSIS — IMO0002 Reserved for concepts with insufficient information to code with codable children: Secondary | ICD-10-CM

## 2016-01-05 MED ORDER — DICLOFENAC SODIUM 75 MG PO TBEC: 75.0000 mg | DELAYED_RELEASE_TABLET | Freq: Two times a day (BID) | ORAL | Status: DC

## 2016-01-05 NOTE — Telephone Encounter (Signed)
Pt aware per vm med left up front for p/u. Pt to contact me if she wants it mailed.

## 2016-01-08 NOTE — Progress Notes (Signed)
Patient ID: Christy DresserLisa K Conway, female   DOB: 17-Sep-1960, 55 y.o.   MRN: [id number]  Subjective: 55 year old female presents the office today for concerns of pain to follow up with a feet. She says the ulna time she is having discomfort is when she does walk on hardwood floors barefoot and this is intermittent. She states that when she wears her inserts and wear supportive shoes she is having no pain. Denies any recent injury or trauma. No swelling or redness. Denies any systemic complaints such as fevers, chills, nausea, vomiting. No acute changes since last appointment, and no other complaints at this time.   Objective: AAO x3, NAD DP/PT pulses palpable bilaterally, CRT less than 3 seconds Protective sensation intact with Simms Weinstein monofilament There is mild discomfort along the plantar aspect of the sesamoids bilaterally. There is also some mild discomfort on the metatarsal heads plan I am both feet subjectively when going barefoot however she is not having any discomfort today to these areas. There is very minimal edema along the plantar sesamoids bilaterally. There is no associate erythema or increase in warmth. There is no pain with MTPJ range of motion. No pain vibratory sensation. No areas of pinpoint bony tenderness or pain with vibratory sensation. MMT 5/5, ROM WNL. No edema, erythema, increase in warmth to bilateral lower extremities.  No open lesions or pre-ulcerative lesions.  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  -X-rays were obtained and reviewed with the patient. Chronic stress changes to bilateral sesamoids. This likely chronic. No evidence of acute fracture. -Only time that she is having symptoms is when she goes barefoot on hardwood surfaces. Discussed that is very difficult  to treat this however she does have some swelling and I recommended her to use an anti-inflammatory to help decrease the swelling and that should hopefully help with the pain as well. Prescribed full tear in discussed side effects. I recommended continue supportive shoe gear all the time and even to wear shoe around the house if possible. Ice to the area as well. -Follow up if symptoms continue in 6 weeks or sooner if any issues are to arise. -Patient encouraged to call the office with any questions, concerns, change in symptoms.   Christy CurdMatthew Conway, DPM

## 2016-02-16 ENCOUNTER — Ambulatory Visit: Payer: BC Managed Care – PPO | Admitting: Podiatry

## 2016-04-03 ENCOUNTER — Telehealth: Payer: Self-pay | Admitting: *Deleted

## 2016-04-03 ENCOUNTER — Other Ambulatory Visit: Payer: Self-pay | Admitting: Obstetrics and Gynecology

## 2016-04-03 MED ORDER — PREDNISONE 10 MG (21) PO TBPK: 10.0000 mg | ORAL_TABLET | Freq: Every day | ORAL | 1 refills | Status: DC

## 2016-04-03 NOTE — Telephone Encounter (Signed)
Pt would like rx for Prednisone taper

## 2016-07-02 NOTE — Progress Notes (Signed)
Patient ID: Christy Conway, female   DOB: 1960-09-21, 55 y.o.   MRN: [id number] ANNUAL PREVENTATIVE CARE GYN  ENCOUNTER NOTE  Subjective:       Christy Conway is a 55 y.o. [id number]  female here for a routine annual gynecologic exam.  Current complaints:  1. Vaginal dryness  Currently on Estratest 5 years. Pros and cons of continuing medication were reviewed.    Gynecologic History No LMP recorded (lmp unknown). Patient has had a hysterectomy. Contraception: status post hysterectomy, TVH;Laparoscopic BSO, on HRT Last Pap: not needed. Results were: normal Last mammogram: 06/21/2016 wnl. Results were: normal 1 SVD, 1 C section twins  Obstetric History OB History  No data available    Past Medical History:  Diagnosis Date  . Decreased libido   . Family history of ovarian cancer   . Prediabetes   . Status post vaginal hysterectomy   . Stress fracture of right foot   . SUI (stress urinary incontinence, female)   . Surgical menopause   . 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 ABDOMINAL HYSTERECTOMY  1994   transvaginal- cak  . BILATERAL SALPINGOOPHORECTOMY     and adhesiolysis  . CESAREAN SECTION  1991 twins  . KNEE SURGERY    . SHOULDER SURGERY Right     Current Outpatient Prescriptions on File Prior to Visit  Medication Sig Dispense Refill  . calcium-vitamin D 250-100 MG-UNIT per tablet Take 1 tablet by mouth 2 (two) times daily.    . diclofenac (VOLTAREN) 75 MG EC tablet Take 1 tablet (75 mg total) by mouth 2 (two) times daily. 30 tablet 2  . Estradiol 10 MCG TABS vaginal tablet Place 1 tablet (10 mcg total) vaginally daily. 90 tablet 3  . estrogen-methylTESTOSTERone (ESTRATEST) 1.25-2.5 MG tablet TAKE 1 TABLET BY MOUTH DAILY 30 tablet 5  . ibuprofen (ADVIL,MOTRIN) 800 MG tablet Take 1 tablet (800 mg total) by mouth every 8 (eight) hours as needed. 30 tablet 6  . predniSONE  (STERAPRED UNI-PAK 21 TAB) 10 MG (21) TBPK tablet Take 1 tablet (10 mg total) by mouth daily. 21 tablet 1   No current facility-administered medications on file prior to visit.     No Known Allergies  Social History   Social History  . Marital status: Married    Spouse name: N/A  . Number of children: N/A  . Years of education: N/A   Occupational History  . Not on file.   Social History Main Topics  . Smoking status: Never Smoker  . Smokeless tobacco: Never Used  . Alcohol use No  . Drug use: No  . Sexual activity: Yes    Birth control/ protection: Surgical   Other Topics Concern  . Not on file   Social History Narrative  . No narrative on file    Family History  Problem Relation Age of Onset  . Ovarian cancer Mother [id number]  . Colon cancer Neg Hx   . Breast cancer Neg Hx   . Diabetes Neg Hx   . Heart disease Maternal Grandmother   . Heart disease Maternal Grandfather     The following portions of the patient's history were reviewed and updated as appropriate: allergies, current medications, past family history, past medical history, past social history, past surgical history and problem list.  Review of Systems ROS Review of Systems - General ROS: negative for - chills, fatigue, fever,  hot flashes, night sweats, weight gain or weight loss Psychological ROS: negative for - anxiety, decreased libido, depression, mood swings, physical abuse or sexual abuse Ophthalmic ROS: negative for - blurry vision, eye pain or loss of vision ENT ROS: negative for - headaches, hearing change, visual changes or vocal changes Allergy and Immunology ROS: negative for - hives, itchy/watery eyes or seasonal allergies Hematological and Lymphatic ROS: negative for - bleeding problems, bruising, swollen lymph nodes or weight loss Endocrine ROS: negative for - galactorrhea, hair pattern changes, hot flashes, malaise/lethargy, mood swings, palpitations, polydipsia/polyuria, skin changes,  temperature intolerance or unexpected weight changes Breast ROS: negative for - new or changing breast lumps or nipple discharge Respiratory ROS: negative for - cough or shortness of breath Cardiovascular ROS: negative for - chest pain, irregular heartbeat, palpitations or shortness of breath Gastrointestinal ROS: no abdominal pain, change in bowel habits, or black or bloody stools Genito-Urinary ROS: no dysuria, trouble voiding, or hematuria Musculoskeletal ROS: negative for - joint pain or joint stiffness Neurological ROS: negative for - bowel and bladder control changes Dermatological ROS: negative for rash and skin lesion changes   Objective:   LMP  (LMP Unknown)  BP 113/69   Pulse 72   Ht 5\' 4"  (1.626 m)   Wt 135 lb 1.6 oz (61.3 kg)   LMP  (LMP Unknown)   BMI 23.19 kg/m   CONSTITUTIONAL: Well-developed, well-nourished female in no acute distress.  PSYCHIATRIC: Normal mood and affect. Normal behavior. Normal judgment and thought content. NEUROLGIC: Alert and oriented to person, place, and time. Normal muscle tone coordination. No cranial nerve deficit noted. HENT:  Normocephalic, atraumatic, External right and left ear normal. Oropharynx is clear and moist EYES: Conjunctivae and EOM are normal. Pupils are equal, round, and reactive to light. No scleral icterus.  NECK: Normal range of motion, supple, no masses.  Normal thyroid.  SKIN: Skin is warm and dry. No rash noted. Not diaphoretic. No erythema. No pallor. CARDIOVASCULAR: Normal heart rate noted, regular rhythm, no murmur. RESPIRATORY: Clear to auscultation bilaterally. Effort and breath sounds normal, no problems with respiration noted. BREASTS: Symmetric in size. No masses, skin changes, nipple drainage, or lymphadenopathy. ABDOMEN: Soft, normal bowel sounds, no distention noted.  No tenderness, rebound or guarding.  BLADDER: Normal PELVIC:  External Genitalia: Normal  BUS: Normal  Vagina: Normal; Moderate milky  secretions in vaginal vault  Cervix: Surgically absent  Uterus: Surgically absent  Adnexa: Normal  RV: External Exam NormaI, No Rectal Masses and Normal Sphincter tone  MUSCULOSKELETAL: Normal range of motion. No tenderness.  No cyanosis, clubbing, or edema.  2+ distal pulses. LYMPHATIC: No Axillary, Supraclavicular, or Inguinal Adenopathy.    Assessment:   Annual gynecologic examination 55 y.o. Contraception: status post hysterectomy, TVH;Laparoscopic BSO Family history of ovarian cancer (mother) Normal BMI   Plan:  Pap: Not needed Mammogram: utd Stool Guaiac Testing:  Ordered Labs: lipid vit d fbs a1c tsh Routine preventative health maintenance measures emphasized: Exercise/Diet/Weight control, Tobacco Warnings and Alcohol/Substance use risks  Trial of Intrarosa vaginal suppositories Refill Estratest Return to Clinic - 1 Year   Christy Conway, CMA  Christy HarmsMartin A Cherine Drumgoole, MD thank you aches  Note: This dictation was prepared with Dragon dictation along with smaller phrase technology. Any transcriptional errors that result from this process are unintentional.

## 2016-07-03 ENCOUNTER — Ambulatory Visit (INDEPENDENT_AMBULATORY_CARE_PROVIDER_SITE_OTHER): Payer: BC Managed Care – PPO | Admitting: Obstetrics and Gynecology

## 2016-07-03 ENCOUNTER — Encounter: Payer: Self-pay | Admitting: Obstetrics and Gynecology

## 2016-07-03 VITALS — BP 113/69 | HR 72 | Ht 64.0 in | Wt 135.1 lb

## 2016-07-03 DIAGNOSIS — Z8041 Family history of malignant neoplasm of ovary: Secondary | ICD-10-CM

## 2016-07-03 DIAGNOSIS — E8940 Asymptomatic postprocedural ovarian failure: Secondary | ICD-10-CM | POA: Diagnosis not present

## 2016-07-03 DIAGNOSIS — N898 Other specified noninflammatory disorders of vagina: Secondary | ICD-10-CM | POA: Diagnosis not present

## 2016-07-03 DIAGNOSIS — Z1211 Encounter for screening for malignant neoplasm of colon: Secondary | ICD-10-CM | POA: Diagnosis not present

## 2016-07-03 DIAGNOSIS — Z01419 Encounter for gynecological examination (general) (routine) without abnormal findings: Secondary | ICD-10-CM | POA: Diagnosis not present

## 2016-07-03 MED ORDER — EST ESTROGENS-METHYLTEST 1.25-2.5 MG PO TABS: 1.0000 | ORAL_TABLET | Freq: Every day | ORAL | 1 refills | Status: DC

## 2016-07-03 MED ORDER — IBUPROFEN 800 MG PO TABS: 800.0000 mg | ORAL_TABLET | Freq: Three times a day (TID) | ORAL | 1 refills | Status: DC | PRN

## 2016-07-03 NOTE — Patient Instructions (Signed)

## 2016-09-25 ENCOUNTER — Ambulatory Visit (INDEPENDENT_AMBULATORY_CARE_PROVIDER_SITE_OTHER): Payer: BC Managed Care – PPO

## 2016-09-25 ENCOUNTER — Ambulatory Visit (INDEPENDENT_AMBULATORY_CARE_PROVIDER_SITE_OTHER): Payer: BC Managed Care – PPO | Admitting: Podiatry

## 2016-09-25 DIAGNOSIS — M7751 Other enthesopathy of right foot: Secondary | ICD-10-CM

## 2016-09-25 DIAGNOSIS — M2580 Other specified joint disorders, unspecified joint: Secondary | ICD-10-CM

## 2016-09-25 DIAGNOSIS — R52 Pain, unspecified: Secondary | ICD-10-CM

## 2016-09-25 MED ORDER — MELOXICAM 15 MG PO TABS: 15.0000 mg | ORAL_TABLET | Freq: Every day | ORAL | 1 refills | Status: AC

## 2016-10-06 MED ORDER — BETAMETHASONE SOD PHOS & ACET 6 (3-3) MG/ML IJ SUSP: 3.0000 mg | Freq: Once | INTRAMUSCULAR | Status: DC

## 2016-10-06 NOTE — Progress Notes (Signed)
   Subjective:  Patient presents today for new complaint of right foot pain. Patient states that she stepped on a golf ball approximately 2 weeks ago. Patient is afraid that she reinjured a previous injury and fracture. Patient states this been significantly painful ever since the injury. Patient presents today for further treatment and evaluation    Objective/Physical Exam General: The patient is alert and oriented x3 in no acute distress.  Dermatology: Skin is warm, dry and supple bilateral lower extremities. Negative for open lesions or macerations.  Vascular: Palpable pedal pulses bilaterally. No edema or erythema noted. Capillary refill within normal limits.  Neurological: Epicritic and protective threshold grossly intact bilaterally.   Musculoskeletal Exam: Significant pain on palpation is noted to the plantar aspect of the first MPJ of the sesamoidal apparatus consistent with a possible sesamoid fracture or sesamoiditis area  Radiographic Exam:  Normal osseous mineralization. Joint spaces preserved. No fracture/dislocation/boney destruction.  No evidence of sesamoid fracture  Assessment: #1 Sesamoiditis right foot #2 pain and edema right foot   Plan of Care:  #1 Patient was evaluated. #2 Injection of 0.5 mL Celestone Soluspan injected patient's first MPJ right foot #3 continue wearing orthotics  #4 prescription for meloxicam 15 mg #5 return to clinic in 4 weeks   Felecia ShellingBrent M. Jayquan Bradsher, DPM [facility]  Dr. Felecia ShellingBrent M. Argus Caraher, DPM    [address]                                        [place], [place] [address]                Office [phone]  Fax [phone]

## 2016-10-23 ENCOUNTER — Ambulatory Visit: Payer: BC Managed Care – PPO | Admitting: Podiatry

## 2016-12-21 ENCOUNTER — Other Ambulatory Visit: Payer: Self-pay | Admitting: Obstetrics and Gynecology

## 2016-12-21 ENCOUNTER — Other Ambulatory Visit: Payer: BC Managed Care – PPO

## 2016-12-21 ENCOUNTER — Ambulatory Visit (INDEPENDENT_AMBULATORY_CARE_PROVIDER_SITE_OTHER): Payer: BC Managed Care – PPO | Admitting: Obstetrics and Gynecology

## 2016-12-21 ENCOUNTER — Encounter: Payer: Self-pay | Admitting: Obstetrics and Gynecology

## 2016-12-21 VITALS — BP 119/78 | HR 80 | Ht 64.0 in

## 2016-12-21 DIAGNOSIS — N764 Abscess of vulva: Secondary | ICD-10-CM | POA: Insufficient documentation

## 2016-12-21 DIAGNOSIS — R102 Pelvic and perineal pain: Secondary | ICD-10-CM

## 2016-12-21 NOTE — Progress Notes (Signed)
GYN ENCOUNTER NOTE  Subjective:       Christy Conway is a 56 y.o. No obstetric history on file. female is here for gynecologic evaluation of the following issues:  1. Left labial abscess  Patient presents for evaluation of acute onset pelvic pain starting last night. It is uncomfortable to sit. She did not notice any significant drainage. She denies fevers chills or sweats..     Gynecologic History Status post hysterectomy BSO Obstetric History OB History  No data available    Past Medical History:  Diagnosis Date  . Decreased libido   . Family history of ovarian cancer   . Prediabetes   . Status post vaginal hysterectomy   . Stress fracture of right foot   . SUI (stress urinary incontinence, female)   . Surgical menopause   . 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 ABDOMINAL HYSTERECTOMY  1994   transvaginal- cak  . BILATERAL SALPINGOOPHORECTOMY     and adhesiolysis  . CESAREAN SECTION  1991 twins  . KNEE SURGERY    . SHOULDER SURGERY Right     Current Outpatient Prescriptions on File Prior to Visit  Medication Sig Dispense Refill  . calcium-vitamin D 250-100 MG-UNIT per tablet Take 1 tablet by mouth 2 (two) times daily.    Marland Kitchen estrogens-methylTEST (ESTRATEST) 1.25-2.5 MG tablet Take 1 tablet by mouth daily. 90 tablet 1  . ibuprofen (ADVIL,MOTRIN) 800 MG tablet Take 1 tablet (800 mg total) by mouth every 8 (eight) hours as needed. 90 tablet 1   Current Facility-Administered Medications on File Prior to Visit  Medication Dose Route Frequency Provider Last Rate Last Dose  . betamethasone acetate-betamethasone sodium phosphate (CELESTONE) injection 3 mg  3 mg Intramuscular Once Felecia Shelling, DPM        No Known Allergies  Social History   Social History  . Marital status: Married    Spouse name: N/A  . Number of children: N/A  . Years of education: N/A    Occupational History  . Not on file.   Social History Main Topics  . Smoking status: Never Smoker  . Smokeless tobacco: Never Used  . Alcohol use No  . Drug use: No  . Sexual activity: Yes    Birth control/ protection: Surgical   Other Topics Concern  . Not on file   Social History Narrative  . No narrative on file    Family History  Problem Relation Age of Onset  . Ovarian cancer Mother 38  . Heart disease Maternal Grandmother   . Heart disease Maternal Grandfather   . Colon cancer Neg Hx   . Breast cancer Neg Hx   . Diabetes Neg Hx     The following portions of the patient's history were reviewed and updated as appropriate: allergies, current medications, past family history, past medical history, past social history, past surgical history and problem list.  Review of Systems As per history of present illness Objective:   BP 119/78   Pulse 80   Ht  (1.626 m)   LMP  (LMP Unknown)  CONSTITUTIONAL: Well-developed, well-nourished female in no acute distress.  HENT:  Normocephalic, atraumatic.  NECK:Not examined SKIN: Skin is warm and dry. No rash noted. Not diaphoretic. No erythema. No pallor. NEUROLGIC: Alert and oriented to person, place, and time. PSYCHIATRIC: Normal mood and affect. Normal behavior. Normal judgment and thought content. CARDIOVASCULAR:Not  Examined RESPIRATORY: Not Examined BREASTS: Not Examined ABDOMEN: Soft, non distended; Non tender.  No Organomegaly. PELVIC:  External Genitalia: 1 cm left labia majora pustule/abscess with surrounding erythema.  BUS: Normal; no evidence of Bartholin's gland involvement  Vagina: No palpable mass  Cervix: Surgically absent  Uterus: Surgically absent  Adnexa: Surgically absent  RV: Normal external exam  Bladder: Nontender MUSCULOSKELETAL: Normal range of motion. No tenderness.  No cyanosis, clubbing, or edema.   PROCEDURE: Vulvar incision and drainage-abscess Verbal consent is obtained. Patient was  placed in the dorsal lithotomy position. The left labia majora abscess site was cleansed with Betadine. Several cc of 1% lidocaine without epinephrine is injected locally. A #11 blade was used to make a stab wound in the abscess. Hemostat was used to separate loculations. Minimal purulent drainage is removed. No packing was placed. Procedure was well-tolerated. Blood loss is minimal.    Assessment:   1. Vulvar abscess (Left labia majora skin abscess-1 cm diameter)     Plan:   1. Incision and drainage of abscesses performed 2. Cyst baths once or twice a day 3. Return in 1 week if symptoms of perineal discomfort persist 4. Tylenol and/or Advil as needed for pain relief is recommended  A total of 15 minutes were spent face-to-face with the patient during this encounter and over half of that time dealt with counseling and coordination of care.  Herold Harms, MD  Note: This dictation was prepared with Dragon dictation along with smaller phrase technology. Any transcriptional errors that result from this process are unintentional.

## 2016-12-21 NOTE — Patient Instructions (Signed)
1. Small vulvar abscess is evacuated today 2. Recommend cyst baths once or twice daily 3. No antibiotics are necessary at this time 4. Follow up if the abscess remains symptomatic in the next week

## 2017-02-14 ENCOUNTER — Other Ambulatory Visit: Payer: Self-pay

## 2017-02-14 MED ORDER — EST ESTROGENS-METHYLTEST 1.25-2.5 MG PO TABS: 1.0000 | ORAL_TABLET | Freq: Every day | ORAL | 1 refills | Status: DC

## 2017-03-18 ENCOUNTER — Ambulatory Visit (INDEPENDENT_AMBULATORY_CARE_PROVIDER_SITE_OTHER): Payer: BC Managed Care – PPO

## 2017-03-18 ENCOUNTER — Ambulatory Visit (INDEPENDENT_AMBULATORY_CARE_PROVIDER_SITE_OTHER): Payer: BC Managed Care – PPO | Admitting: Podiatry

## 2017-03-18 DIAGNOSIS — M7751 Other enthesopathy of right foot: Secondary | ICD-10-CM

## 2017-03-18 DIAGNOSIS — M2580 Other specified joint disorders, unspecified joint: Secondary | ICD-10-CM

## 2017-03-18 DIAGNOSIS — M779 Enthesopathy, unspecified: Secondary | ICD-10-CM

## 2017-03-18 NOTE — Progress Notes (Signed)
Subjective: 56 year old female presents the office today for concerns of a pulling sensation from her big toe and the ball the foot on the right side which is been ongoing the last 3 months. She states that she has similar symptoms in January when she saw Dr. Logan BoresEvans and had an injection performed which did help. She is also while possible new orthotics. No recent injury or trauma. Denies any swelling or redness. Denies any systemic complaints such as fevers, chills, nausea, vomiting. No acute changes since last appointment, and no other complaints at this time.   Objective: AAO x3, NAD DP/PT pulses palpable bilaterally, CRT less than 3 seconds There is tenderness palpation most along the lateral sesamoid on the right foot. There is no significant edema there is no erythema or increase in warmth. There is no pain with MPJ range of motion. Is no specific area pinpoint bony tenderness or pain the vibratory sensation. No open lesions or pre-ulcerative lesions.  No pain with calf compression, swelling, warmth, erythema  Assessment: Sesamoiditis right foot, tendinitis  Plan: -All treatment options discussed with the patient including all alternatives, risks, complications.  -Discussed steroid injection. Today a steroid injection was performed without any competitions. Immature Kenalog and local insert was infiltrated into the area of maximal tenderness without complications. Post injection care was discussed. -She was molded for new orthotics today and there is some to [facility]. -RTC 3 weeks to pick up inserts or sooner if needed. Patient requests to be picking [place] orthotics in the [place] office.  -Patient encouraged to call the office with any questions, concerns, change in symptoms.   Ovid CurdMatthew Aslan Montagna, DPM

## 2017-04-10 ENCOUNTER — Encounter: Payer: BC Managed Care – PPO | Admitting: Orthotics

## 2017-07-04 ENCOUNTER — Encounter: Payer: Self-pay | Admitting: Obstetrics and Gynecology

## 2017-07-04 ENCOUNTER — Ambulatory Visit (INDEPENDENT_AMBULATORY_CARE_PROVIDER_SITE_OTHER): Payer: BC Managed Care – PPO | Admitting: Obstetrics and Gynecology

## 2017-07-04 VITALS — BP 135/83 | HR 73 | Ht 64.0 in | Wt 139.8 lb

## 2017-07-04 DIAGNOSIS — Z01419 Encounter for gynecological examination (general) (routine) without abnormal findings: Secondary | ICD-10-CM

## 2017-07-04 DIAGNOSIS — B373 Candidiasis of vulva and vagina: Secondary | ICD-10-CM | POA: Diagnosis not present

## 2017-07-04 DIAGNOSIS — B3731 Acute candidiasis of vulva and vagina: Secondary | ICD-10-CM | POA: Insufficient documentation

## 2017-07-04 DIAGNOSIS — Z8041 Family history of malignant neoplasm of ovary: Secondary | ICD-10-CM | POA: Diagnosis not present

## 2017-07-04 DIAGNOSIS — Z90710 Acquired absence of both cervix and uterus: Secondary | ICD-10-CM | POA: Diagnosis not present

## 2017-07-04 DIAGNOSIS — N941 Unspecified dyspareunia: Secondary | ICD-10-CM | POA: Diagnosis not present

## 2017-07-04 DIAGNOSIS — Z1211 Encounter for screening for malignant neoplasm of colon: Secondary | ICD-10-CM

## 2017-07-04 DIAGNOSIS — N898 Other specified noninflammatory disorders of vagina: Secondary | ICD-10-CM | POA: Diagnosis not present

## 2017-07-04 MED ORDER — ESTRADIOL 10 MCG VA INST: 10.0000 ug | VAGINAL_INSERT | VAGINAL | 6 refills | Status: DC

## 2017-07-04 MED ORDER — IBUPROFEN 800 MG PO TABS: 800.0000 mg | ORAL_TABLET | Freq: Three times a day (TID) | ORAL | 1 refills | Status: DC | PRN

## 2017-07-04 NOTE — Progress Notes (Signed)
ANNUAL PREVENTATIVE CARE GYN  ENCOUNTER NOTE  Subjective:       [name] is a 56 y.o. [id number] female here for a routine annual gynecologic exam.  Current complaints: 1. Wants to stop estratest; patient has been taking the medication every other day without vasomotor symptoms. 2.  Dyspareunia patient has noted a worsening discomfort with intercourse of late.  She did an initial trial of Intrarosa suppositories; there seem to be a waning effect and she did stop the medication.  She is using Replens intermittently for the dryness.  Bowel and bladder function are normal. The patient reports no major interval health issues.   Gynecologic History No LMP recorded (lmp unknown). Patient has had a hysterectomy. Contraception: status post hysterectomy Last Pap: no needed. Results were: normal Last mammogram: 2017 wnl. Results were: normal  Obstetric History OB History  Gravida Para Term Preterm AB Living  2 2 1 1   2   SAB TAB Ectopic Multiple Live Births        1 2    # Outcome Date GA Lbr Len/2nd Weight Sex Delivery Anes PTL Lv  2A Preterm 1991    M CS-LTranv   LIV  2B Preterm 1991     CS-LTranv     1 Term 1989   6 lb 3.2 oz (2.812 kg) M Vag-Spont   LIV      Past Medical History:  Diagnosis Date  . Decreased libido   . Family history of ovarian cancer   . Prediabetes   . Status post vaginal hysterectomy   . Stress fracture of right foot   . SUI (stress urinary incontinence, female)   . Surgical menopause   . 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 ABDOMINAL HYSTERECTOMY  1994   transvaginal- cak  . BILATERAL SALPINGOOPHORECTOMY     and adhesiolysis  . CESAREAN SECTION  1991 twins  . KNEE SURGERY    . SHOULDER SURGERY Right     Current Outpatient Medications on File Prior to Visit  Medication Sig Dispense Refill  . calcium-vitamin D 250-100 MG-UNIT per tablet Take 1  tablet by mouth 2 (two) times daily.    . cyanocobalamin 500 MCG tablet Take 500 mcg by mouth daily.    Marland Kitchen. estrogens-methylTEST (ESTRATEST) 1.25-2.5 MG tablet Take 1 tablet by mouth daily. 90 tablet 1  . ibuprofen (ADVIL,MOTRIN) 800 MG tablet Take 1 tablet (800 mg total) by mouth every 8 (eight) hours as needed. 90 tablet 1   No current facility-administered medications on file prior to visit.     No Known Allergies  Social History   Socioeconomic History  . Marital status: Married    Spouse name: Not on file  . Number of children: Not on file  . Years of education: Not on file  . Highest education level: Not on file  Social Needs  . Financial resource strain: Not on file  . Food insecurity - worry: Not on file  . Food insecurity - inability: Not on file  . Transportation needs - medical: Not on file  . Transportation needs - non-medical: Not on file  Occupational History  . Not on file  Tobacco Use  . Smoking status: Never Smoker  . Smokeless tobacco: Never Used  Substance and Sexual Activity  . Alcohol use: No    Alcohol/week: 0.0 oz  . Drug use: No  . Sexual activity:  Yes    Birth control/protection: Surgical  Other Topics Concern  . Not on file  Social History Narrative  . Not on file    Family History  Problem Relation Age of Onset  . Ovarian cancer Mother [id number]  . Heart disease Maternal Grandmother   . Heart disease Maternal Grandfather   . Colon cancer Neg Hx   . Breast cancer Neg Hx   . Diabetes Neg Hx     The following portions of the patient's history were reviewed and updated as appropriate: allergies, current medications, past family history, past medical history, past social history, past surgical history and problem list.  Review of Systems ROS Review of Systems - General ROS: negative for - chills, fatigue, fever, hot flashes, night sweats, weight gain or weight loss Psychological ROS: negative for - anxiety, decreased libido, depression, mood  swings, physical abuse or sexual abuse Ophthalmic ROS: negative for - blurry vision, eye pain or loss of vision ENT ROS: negative for - headaches, hearing change, visual changes or vocal changes Allergy and Immunology ROS: negative for - hives, itchy/watery eyes or seasonal allergies Hematological and Lymphatic ROS: negative for - bleeding problems, bruising, swollen lymph nodes or weight loss Endocrine ROS: negative for - galactorrhea, hair pattern changes, hot flashes, malaise/lethargy, mood swings, palpitations, polydipsia/polyuria, skin changes, temperature intolerance or unexpected weight changes Breast ROS: negative for - new or changing breast lumps or nipple discharge Respiratory ROS: negative for - cough or shortness of breath Cardiovascular ROS: negative for - chest pain, irregular heartbeat, palpitations or shortness of breath Gastrointestinal ROS: no abdominal pain, change in bowel habits, or black or bloody stools Genito-Urinary ROS: no dysuria, trouble voiding, or hematuria.  Dyspareunia is worsening. Musculoskeletal ROS: negative for - joint pain or joint stiffness Neurological ROS: negative for - bowel and bladder control changes Dermatological ROS: negative for rash and skin lesion changes   Objective:   BP 135/83   Pulse 73   Ht 5\' 4"  (1.626 m)   Wt 139 lb 12.8 oz (63.4 kg)   LMP  (LMP Unknown)   BMI 24.00 kg/m  CONSTITUTIONAL: Well-developed, well-nourished female in no acute distress.  PSYCHIATRIC: Normal mood and affect. Normal behavior. Normal judgment and thought content. NEUROLGIC: Alert and oriented to person, place, and time. Normal muscle tone coordination. No cranial nerve deficit noted. HENT:  Normocephalic, atraumatic, External right and left ear normal. Oropharynx is clear and moist EYES: Conjunctivae and EOM are normal. No scleral icterus.  NECK: Normal range of motion, supple, no masses.  Normal thyroid.  SKIN: Skin is warm and dry. No rash noted. Not  diaphoretic. No erythema. No pallor. CARDIOVASCULAR: Normal heart rate noted, regular rhythm, no murmur. RESPIRATORY: Clear to auscultation bilaterally. Effort and breath sounds normal, no problems with respiration noted. BREASTS: Symmetric in size. No masses, skin changes, nipple drainage, or lymphadenopathy. ABDOMEN: Soft, normal bowel sounds, no distention noted.  No tenderness, rebound or guarding.  BLADDER: Normal PELVIC:  External Genitalia: Normal  BUS: Normal  Vagina: Minimal thick cottage cheese discharge noted within the vagina  Cervix: Surgically absent  Uterus: Surgically absent  Adnexa: Surgically absent  RV: External Exam NormaI, No Rectal Masses and Normal Sphincter tone  MUSCULOSKELETAL: Normal range of motion. No tenderness.  No cyanosis, clubbing, or edema.  2+ distal pulses. LYMPHATIC: No Axillary, Supraclavicular, or Inguinal Adenopathy.    Assessment:   Annual gynecologic examination 56 y.o. Contraception: status post hysterectomy Normal BMI Menopausal symptoms Monilia vaginitis Dyspareunia  Plan:  Pap: Not needed Mammogram: Ordered Stool Guaiac Testing:  Ordered Labs: lipid tsh a1c fbs Routine preventative health maintenance measures emphasized: Exercise/Diet/Weight control, Tobacco Warnings and Alcohol/Substance use risks Nuswab to rule out vaginitis Trial of Invexy 10 mcg twice a week intravaginal Various lubricants discussed Return to Clinic - 1 Year   Crystal [place], CMA  Herold Harms, MD  Note: This dictation was prepared with Dragon dictation along with smaller phrase technology. Any transcriptional errors that result from this process are unintentional.  '

## 2017-07-04 NOTE — Patient Instructions (Addendum)
1.  Nuswab is performed today to rule out vaginitis 2.  Mammogram is ordered 3.  Screening labs are ordered 4.  Stool guaiac card testing is given for colon cancer screening 5.  Continue with healthy eating and exercise 6.  Discontinue Estratest hormone replacement therapy 7.  Begin Invexy vaginal estrogen therapy twice weekly 8.  Lubricant recommendations include:  Virgin olive oil  Coconut oil  Astroglide  JO H2O lubricant 9.  Return in 1 year for annual exam 10.  Recommend Monistat over-the-counter therapy for yeast infection   Health Maintenance for Postmenopausal Women Menopause is a normal process in which your reproductive ability comes to an end. This process happens gradually over a span of months to years, usually between the ages of 9 and 37. Menopause is complete when you have missed 12 consecutive menstrual periods. It is important to talk with your health care provider about some of the most common conditions that affect postmenopausal women, such as heart disease, cancer, and bone loss (osteoporosis). Adopting a healthy lifestyle and getting preventive care can help to promote your health and wellness. Those actions can also lower your chances of developing some of these common conditions. What should I know about menopause? During menopause, you may experience a number of symptoms, such as:  Moderate-to-severe hot flashes.  Night sweats.  Decrease in sex drive.  Mood swings.  Headaches.  Tiredness.  Irritability.  Memory problems.  Insomnia.  Choosing to treat or not to treat menopausal changes is an individual decision that you make with your health care provider. What should I know about hormone replacement therapy and supplements? Hormone therapy products are effective for treating symptoms that are associated with menopause, such as hot flashes and night sweats. Hormone replacement carries certain risks, especially as you become older. If you are  thinking about using estrogen or estrogen with progestin treatments, discuss the benefits and risks with your health care provider. What should I know about heart disease and stroke? Heart disease, heart attack, and stroke become more likely as you age. This may be due, in part, to the hormonal changes that your body experiences during menopause. These can affect how your body processes dietary fats, triglycerides, and cholesterol. Heart attack and stroke are both medical emergencies. There are many things that you can do to help prevent heart disease and stroke:  Have your blood pressure checked at least every 1-2 years. High blood pressure causes heart disease and increases the risk of stroke.  If you are 29-5 years old, ask your health care provider if you should take aspirin to prevent a heart attack or a stroke.  Do not use any tobacco products, including cigarettes, chewing tobacco, or electronic cigarettes. If you need help quitting, ask your health care provider.  It is important to eat a healthy diet and maintain a healthy weight. ? Be sure to include plenty of vegetables, fruits, low-fat dairy products, and lean protein. ? Avoid eating foods that are high in solid fats, added sugars, or salt (sodium).  Get regular exercise. This is one of the most important things that you can do for your health. ? Try to exercise for at least 150 minutes each week. The type of exercise that you do should increase your heart rate and make you sweat. This is known as moderate-intensity exercise. ? Try to do strengthening exercises at least twice each week. Do these in addition to the moderate-intensity exercise.  Know your numbers.Ask your health care provider to  check your cholesterol and your blood glucose. Continue to have your blood tested as directed by your health care provider.  What should I know about cancer screening? There are several types of cancer. Take the following steps to reduce  your risk and to catch any cancer development as early as possible. Breast Cancer  Practice breast self-awareness. ? This means understanding how your breasts normally appear and feel. ? It also means doing regular breast self-exams. Let your health care provider know about any changes, no matter how small.  If you are 45 or older, have a clinician do a breast exam (clinical breast exam or CBE) every year. Depending on your age, family history, and medical history, it may be recommended that you also have a yearly breast X-ray (mammogram).  If you have a family history of breast cancer, talk with your health care provider about genetic screening.  If you are at high risk for breast cancer, talk with your health care provider about having an MRI and a mammogram every year.  Breast cancer (BRCA) gene test is recommended for women who have family members with BRCA-related cancers. Results of the assessment will determine the need for genetic counseling and BRCA1 and for BRCA2 testing. BRCA-related cancers include these types: ? Breast. This occurs in males or females. ? Ovarian. ? Tubal. This may also be called fallopian tube cancer. ? Cancer of the abdominal or pelvic lining (peritoneal cancer). ? Prostate. ? Pancreatic.  Cervical, Uterine, and Ovarian Cancer Your health care provider may recommend that you be screened regularly for cancer of the pelvic organs. These include your ovaries, uterus, and vagina. This screening involves a pelvic exam, which includes checking for microscopic changes to the surface of your cervix (Pap test).  For women ages 21-65, health care providers may recommend a pelvic exam and a Pap test every three years. For women ages 46-65, they may recommend the Pap test and pelvic exam, combined with testing for human papilloma virus (HPV), every five years. Some types of HPV increase your risk of cervical cancer. Testing for HPV may also be done on women of any age who  have unclear Pap test results.  Other health care providers may not recommend any screening for nonpregnant women who are considered low risk for pelvic cancer and have no symptoms. Ask your health care provider if a screening pelvic exam is right for you.  If you have had past treatment for cervical cancer or a condition that could lead to cancer, you need Pap tests and screening for cancer for at least 20 years after your treatment. If Pap tests have been discontinued for you, your risk factors (such as having a new sexual partner) need to be reassessed to determine if you should start having screenings again. Some women have medical problems that increase the chance of getting cervical cancer. In these cases, your health care provider may recommend that you have screening and Pap tests more often.  If you have a family history of uterine cancer or ovarian cancer, talk with your health care provider about genetic screening.  If you have vaginal bleeding after reaching menopause, tell your health care provider.  There are currently no reliable tests available to screen for ovarian cancer.  Lung Cancer Lung cancer screening is recommended for adults 80-27 years old who are at high risk for lung cancer because of a history of smoking. A yearly low-dose CT scan of the lungs is recommended if you:  Currently smoke.  Have a history of at least 30 pack-years of smoking and you currently smoke or have quit within the past 15 years. A pack-year is smoking an average of one pack of cigarettes per day for one year.  Yearly screening should:  Continue until it has been 15 years since you quit.  Stop if you develop a health problem that would prevent you from having lung cancer treatment.  Colorectal Cancer  This type of cancer can be detected and can often be prevented.  Routine colorectal cancer screening usually begins at age 53 and continues through age 75.  If you have risk factors for colon  cancer, your health care provider may recommend that you be screened at an earlier age.  If you have a family history of colorectal cancer, talk with your health care provider about genetic screening.  Your health care provider may also recommend using home test kits to check for hidden blood in your stool.  A small camera at the end of a tube can be used to examine your colon directly (sigmoidoscopy or colonoscopy). This is done to check for the earliest forms of colorectal cancer.  Direct examination of the colon should be repeated every 5-10 years until age 48. However, if early forms of precancerous polyps or small growths are found or if you have a family history or genetic risk for colorectal cancer, you may need to be screened more often.  Skin Cancer  Check your skin from head to toe regularly.  Monitor any moles. Be sure to tell your health care provider: ? About any new moles or changes in moles, especially if there is a change in a mole's shape or color. ? If you have a mole that is larger than the size of a pencil eraser.  If any of your family members has a history of skin cancer, especially at a young age, talk with your health care provider about genetic screening.  Always use sunscreen. Apply sunscreen liberally and repeatedly throughout the day.  Whenever you are outside, protect yourself by wearing long sleeves, pants, a wide-brimmed hat, and sunglasses.  What should I know about osteoporosis? Osteoporosis is a condition in which bone destruction happens more quickly than new bone creation. After menopause, you may be at an increased risk for osteoporosis. To help prevent osteoporosis or the bone fractures that can happen because of osteoporosis, the following is recommended:  If you are 75-42 years old, get at least 1,000 mg of calcium and at least 600 mg of vitamin D per day.  If you are older than age 31 but younger than age 46, get at least 1,200 mg of calcium and  at least 600 mg of vitamin D per day.  If you are older than age 9, get at least 1,200 mg of calcium and at least 800 mg of vitamin D per day.  Smoking and excessive alcohol intake increase the risk of osteoporosis. Eat foods that are rich in calcium and vitamin D, and do weight-bearing exercises several times each week as directed by your health care provider. What should I know about how menopause affects my mental health? Depression may occur at any age, but it is more common as you become older. Common symptoms of depression include:  Low or sad mood.  Changes in sleep patterns.  Changes in appetite or eating patterns.  Feeling an overall lack of motivation or enjoyment of activities that you previously enjoyed.  Frequent crying spells.  Talk with your  health care provider if you think that you are experiencing depression. What should I know about immunizations? It is important that you get and maintain your immunizations. These include:  Tetanus, diphtheria, and pertussis (Tdap) booster vaccine.  Influenza every year before the flu season begins.  Pneumonia vaccine.  Shingles vaccine.  Your health care provider may also recommend other immunizations. This information is not intended to replace advice given to you by your health care provider. Make sure you discuss any questions you have with your health care provider. Document Released: 10/05/2005 Document Revised: 03/02/2016 Document Reviewed: 05/17/2015 Elsevier Interactive Patient Education  2018 [employer].

## 2017-07-09 LAB — CANDIDA 6 SPECIES PROFILE, NAA

## 2017-07-17 ENCOUNTER — Other Ambulatory Visit: Payer: BC Managed Care – PPO

## 2017-07-18 LAB — LIPID PANEL
CHOLESTEROL TOTAL: 231 mg/dL — AB (ref 100–199)
Chol/HDL Ratio: 3.9 ratio (ref 0.0–4.4)
HDL: 59 mg/dL (ref >39)
LDL CALC: 153 mg/dL — AB (ref 0–99)
TRIGLYCERIDES: 96 mg/dL (ref 0–149)
VLDL Cholesterol Cal: 19 mg/dL (ref 5–40)

## 2017-07-18 LAB — HEMOGLOBIN A1C
ESTIMATED AVERAGE GLUCOSE: 126 mg/dL
HEMOGLOBIN A1C: 6 % — AB (ref 4.8–5.6)

## 2017-07-18 LAB — TSH: TSH: 2.63 u[IU]/mL (ref 0.450–4.500)

## 2017-07-18 LAB — GLUCOSE, RANDOM: GLUCOSE: 116 mg/dL — AB (ref 65–99)

## 2017-07-30 ENCOUNTER — Telehealth: Payer: Self-pay | Admitting: Obstetrics and Gynecology

## 2017-07-30 NOTE — Telephone Encounter (Signed)
The patient called and stated that she would like to speak with Christy Conway in regards to her Lab results. No other information was disclosed. Please advise.

## 2017-08-02 NOTE — Telephone Encounter (Signed)
Spoke with pt

## 2017-08-28 ENCOUNTER — Other Ambulatory Visit: Payer: Self-pay

## 2017-08-28 MED ORDER — EST ESTROGENS-METHYLTEST 1.25-2.5 MG PO TABS: 1.0000 | ORAL_TABLET | Freq: Every day | ORAL | 1 refills | Status: DC

## 2018-07-01 ENCOUNTER — Other Ambulatory Visit: Payer: Self-pay | Admitting: Obstetrics and Gynecology

## 2018-07-07 NOTE — Progress Notes (Signed)
ANNUAL PREVENTATIVE CARE GYN  ENCOUNTER NOTE  Subjective:       Christy Conway is a 57 y.o. G58P1102 female here for a routine annual gynecologic exam.  Current complaints: 1. Concerned about IBS-over the past 4 weeks, Christy Conway has noted increased bloating, cramping, and loose stools; she denies increased mucus or blood in her stool.  She had times has significant fecal urgency, associated with no specific food types.  She has seen Dr. Mechele Collin at [facility] in the past and is willing to follow-up with him regarding her symptoms.  2.  Dyspareunia has gotten better; she has discontinued her vaginal estrogen therapy and does not desire to continue at this time.  3.  Vasomotor symptoms-minimized on Estratest.  She will continue this medication.  Bowel and bladder function are normal. The patient reports no major interval health issues.   Gynecologic History No LMP recorded (lmp unknown). Patient has had a hysterectomy. Contraception: status post hysterectomy Last Pap: no needed. Results were normal. Last mammogram: 2017 wnl. Results were: normal  Obstetric History OB History  Gravida Para Term Preterm AB Living  2 2 1 1   2   SAB TAB Ectopic Multiple Live Births        1 2    # Outcome Date GA Lbr Len/2nd Weight Sex Delivery Anes PTL Lv  2A Preterm 1991    M CS-LTranv   LIV  2B Preterm 1991     CS-LTranv     1 Term 1989   6 lb 3.2 oz (2.812 kg) M Vag-Spont   LIV    Past Medical History:  Diagnosis Date  . Decreased libido   . Family history of ovarian cancer   . Prediabetes   . Status post vaginal hysterectomy   . Stress fracture of right foot   . SUI (stress urinary incontinence, female)   . Surgical menopause   . 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 ABDOMINAL HYSTERECTOMY  1994   transvaginal- cak  . BILATERAL SALPINGOOPHORECTOMY     and adhesiolysis  . CESAREAN SECTION   1991 twins  . KNEE SURGERY    . SHOULDER SURGERY Right     Current Outpatient Medications on File Prior to Visit  Medication Sig Dispense Refill  . calcium-vitamin D 250-100 MG-UNIT per tablet Take 1 tablet by mouth 2 (two) times daily.    . cyanocobalamin 500 MCG tablet Take 500 mcg by mouth daily.    . Estradiol (IMVEXXY STARTER PACK) 10 MCG INST Place 10 mcg 2 (two) times a week vaginally. 8 each 6  . estrogens-methylTEST (ESTRATEST) 1.25-2.5 MG tablet Take 1 tablet by mouth daily. 90 tablet 1  . ibuprofen (ADVIL,MOTRIN) 800 MG tablet TAKE 1 TABLET (800 MG TOTAL) EVERY 8 (EIGHT) HOURS AS NEEDED BY MOUTH. 90 tablet 1   No current facility-administered medications on file prior to visit.     No Known Allergies  Social History   Socioeconomic History  . Marital status: Married    Spouse name: Not on file  . Number of children: Not on file  . Years of education: Not on file  . Highest education level: Not on file  Occupational History  . Not on file  Social Needs  . Financial resource strain: Not on file  . Food insecurity:    Worry: Not on file    Inability: Not on file  .  Transportation needs:    Medical: Not on file    Non-medical: Not on file  Tobacco Use  . Smoking status: Never Smoker  . Smokeless tobacco: Never Used  Substance and Sexual Activity  . Alcohol use: No    Alcohol/week: 0.0 standard drinks  . Drug use: No  . Sexual activity: Yes    Birth control/protection: Surgical  Lifestyle  . Physical activity:    Days per week: 0 days    Minutes per session: 0 min  . Stress: Not at all  Relationships  . Social connections:    Talks on phone: Not on file    Gets together: Not on file    Attends religious service: Not on file    Active member of club or organization: Not on file    Attends meetings of clubs or organizations: Not on file    Relationship status: Not on file  . Intimate partner violence:    Fear of current or ex partner: No    Emotionally  abused: No    Physically abused: No    Forced sexual activity: No  Other Topics Concern  . Not on file  Social History Narrative  . Not on file    Family History  Problem Relation Age of Onset  . Ovarian cancer Mother 34  . Heart disease Maternal Grandmother   . Heart disease Maternal Grandfather   . Colon cancer Neg Hx   . Breast cancer Neg Hx   . Diabetes Neg Hx     The following portions of the patient's history were reviewed and updated as appropriate: allergies, current medications, past family history, past medical history, past social history, past surgical history and problem list.  Review of Systems Review of Systems  Constitutional: Negative.   Respiratory: Negative.   Cardiovascular: Negative.   Gastrointestinal: Negative.   Genitourinary: Negative.   Musculoskeletal: Negative.   Skin: Negative.   Psychiatric/Behavioral: Negative.   All other systems reviewed and are negative.    Objective:   BP (!) 226/82   Ht 5\' 4"  (1.626 m)   Wt 143 lb 6.4 oz (65 kg)   LMP  (LMP Unknown)   BMI 24.61 kg/m  CONSTITUTIONAL: Well-developed, well-nourished female in no acute distress.  PSYCHIATRIC: Normal mood and affect. Normal behavior. Normal judgment and thought content. NEUROLGIC: Alert and oriented to person, place, and time. Normal muscle tone coordination. No cranial nerve deficit noted. HENT:  Normocephalic, atraumatic, External right and left ear normal.  EYES: Conjunctivae and EOM are normal. No scleral icterus.  NECK: Normal range of motion, supple, no masses.  Normal thyroid.  SKIN: Skin is warm and dry. No rash noted. Not diaphoretic. No erythema. No pallor. CARDIOVASCULAR: Normal heart rate noted, regular rhythm, no murmur. RESPIRATORY: Clear to auscultation bilaterally. Effort and breath sounds normal, no problems with respiration noted. BREASTS: Symmetric in size. No masses, skin changes, nipple drainage, or lymphadenopathy. ABDOMEN: Soft, no distention  noted.  No tenderness, rebound or guarding.  BLADDER: Normal PELVIC:  External Genitalia: Normal  BUS: Normal  Vagina: Minimal milky white secretions in vaginal vault; good vault support  Cervix: Surgically absent  Uterus: Surgically absent  Adnexa: Surgically absent  RV: External Exam NormaI rectal exam (internal) has been deferred due to patient self referring to the GI department. MUSCULOSKELETAL: Normal range of motion. No tenderness.  No cyanosis, clubbing, or edema.  2+ distal pulses. LYMPHATIC: No Axillary, Supraclavicular, or Inguinal Adenopathy.    Assessment:   Annual gynecologic  examination 57 y.o. Contraception: status post hysterectomy (TVH); status post laparoscopic BSO and adhesio lysis Normal BMI Menopausal symptoms, controlled with Estratest IBS symptoms-abdominal cramping, loose stools, diarrhea, bloating x4 weeks Family history of ovarian cancer  Plan:  Pap: Not needed Mammogram: Scheduled 07/30/18 Stool Guaiac Testing: ordered.  Labs: lipid tsh a1c fbs Routine preventative health maintenance measures emphasized: Exercise/Diet/Weight control, Tobacco Warnings and Alcohol/Substance use risks Patient to make GI appointment with Dr. Mechele Collin regarding IBS symptoms Discontinue vaginal estrogen therapy Continue Estratest therapy for vasomotor symptom management Return to Clinic - 1 Year-Dr. Lynford Humphrey, CMA  Herold Harms, MD  Note: This dictation was prepared with Dragon dictation along with smaller phrase technology. Any transcriptional errors that result from this process are unintentional.  '

## 2018-07-08 ENCOUNTER — Encounter: Payer: Self-pay | Admitting: Obstetrics and Gynecology

## 2018-07-08 ENCOUNTER — Ambulatory Visit (INDEPENDENT_AMBULATORY_CARE_PROVIDER_SITE_OTHER): Payer: BC Managed Care – PPO | Admitting: Obstetrics and Gynecology

## 2018-07-08 VITALS — BP 226/82 | Ht 64.0 in | Wt 143.4 lb

## 2018-07-08 DIAGNOSIS — Z01419 Encounter for gynecological examination (general) (routine) without abnormal findings: Secondary | ICD-10-CM | POA: Diagnosis not present

## 2018-07-08 DIAGNOSIS — Z90710 Acquired absence of both cervix and uterus: Secondary | ICD-10-CM

## 2018-07-08 DIAGNOSIS — E8940 Asymptomatic postprocedural ovarian failure: Secondary | ICD-10-CM | POA: Diagnosis not present

## 2018-07-08 DIAGNOSIS — Z8041 Family history of malignant neoplasm of ovary: Secondary | ICD-10-CM

## 2018-07-08 MED ORDER — EST ESTROGENS-METHYLTEST 1.25-2.5 MG PO TABS: 1.0000 | ORAL_TABLET | Freq: Every day | ORAL | 1 refills | Status: DC

## 2018-07-08 NOTE — Patient Instructions (Signed)
1.  Pap smear is not done.  No further Pap smears are needed. 2.  Mammogram has already been scheduled 3.  Patient is to contact the [facility] clinic GI department for evaluation of IBS type symptoms that have been ongoing for the past 4 weeks.  Stool guaiac cards are not given. 4.  Screening labs are ordered 5.  Tinea with healthy eating and exercise 6.  Continue with calcium and vitamin D supplementation twice daily 7.  Return in 1 year for annual exam-Dr. [place] Maintenance for Postmenopausal Women Menopause is a normal process in which your reproductive ability comes to an end. This process happens gradually over a span of months to years, usually between the ages of 68 and 6. Menopause is complete when you have missed 12 consecutive menstrual periods. It is important to talk with your health care provider about some of the most common conditions that affect postmenopausal women, such as heart disease, cancer, and bone loss (osteoporosis). Adopting a healthy lifestyle and getting preventive care can help to promote your health and wellness. Those actions can also lower your chances of developing some of these common conditions. What should I know about menopause? During menopause, you may experience a number of symptoms, such as:  Moderate-to-severe hot flashes.  Night sweats.  Decrease in sex drive.  Mood swings.  Headaches.  Tiredness.  Irritability.  Memory problems.  Insomnia.  Choosing to treat or not to treat menopausal changes is an individual decision that you make with your health care provider. What should I know about hormone replacement therapy and supplements? Hormone therapy products are effective for treating symptoms that are associated with menopause, such as hot flashes and night sweats. Hormone replacement carries certain risks, especially as you become older. If you are thinking about using estrogen or estrogen with progestin treatments, discuss the  benefits and risks with your health care provider. What should I know about heart disease and stroke? Heart disease, heart attack, and stroke become more likely as you age. This may be due, in part, to the hormonal changes that your body experiences during menopause. These can affect how your body processes dietary fats, triglycerides, and cholesterol. Heart attack and stroke are both medical emergencies. There are many things that you can do to help prevent heart disease and stroke:  Have your blood pressure checked at least every 1-2 years. High blood pressure causes heart disease and increases the risk of stroke.  If you are 35-54 years old, ask your health care provider if you should take aspirin to prevent a heart attack or a stroke.  Do not use any tobacco products, including cigarettes, chewing tobacco, or electronic cigarettes. If you need help quitting, ask your health care provider.  It is important to eat a healthy diet and maintain a healthy weight. ? Be sure to include plenty of vegetables, fruits, low-fat dairy products, and lean protein. ? Avoid eating foods that are high in solid fats, added sugars, or salt (sodium).  Get regular exercise. This is one of the most important things that you can do for your health. ? Try to exercise for at least 150 minutes each week. The type of exercise that you do should increase your heart rate and make you sweat. This is known as moderate-intensity exercise. ? Try to do strengthening exercises at least twice each week. Do these in addition to the moderate-intensity exercise.  Know your numbers.Ask your health care provider to check your cholesterol and  your blood glucose. Continue to have your blood tested as directed by your health care provider.  What should I know about cancer screening? There are several types of cancer. Take the following steps to reduce your risk and to catch any cancer development as early as possible. Breast  Cancer  Practice breast self-awareness. ? This means understanding how your breasts normally appear and feel. ? It also means doing regular breast self-exams. Let your health care provider know about any changes, no matter how small.  If you are 36 or older, have a clinician do a breast exam (clinical breast exam or CBE) every year. Depending on your age, family history, and medical history, it may be recommended that you also have a yearly breast X-ray (mammogram).  If you have a family history of breast cancer, talk with your health care provider about genetic screening.  If you are at high risk for breast cancer, talk with your health care provider about having an MRI and a mammogram every year.  Breast cancer (BRCA) gene test is recommended for women who have family members with BRCA-related cancers. Results of the assessment will determine the need for genetic counseling and BRCA1 and for BRCA2 testing. BRCA-related cancers include these types: ? Breast. This occurs in males or females. ? Ovarian. ? Tubal. This may also be called fallopian tube cancer. ? Cancer of the abdominal or pelvic lining (peritoneal cancer). ? Prostate. ? Pancreatic.  Cervical, Uterine, and Ovarian Cancer Your health care provider may recommend that you be screened regularly for cancer of the pelvic organs. These include your ovaries, uterus, and vagina. This screening involves a pelvic exam, which includes checking for microscopic changes to the surface of your cervix (Pap test).  For women ages 21-65, health care providers may recommend a pelvic exam and a Pap test every three years. For women ages 54-65, they may recommend the Pap test and pelvic exam, combined with testing for human papilloma virus (HPV), every five years. Some types of HPV increase your risk of cervical cancer. Testing for HPV may also be done on women of any age who have unclear Pap test results.  Other health care providers may not  recommend any screening for nonpregnant women who are considered low risk for pelvic cancer and have no symptoms. Ask your health care provider if a screening pelvic exam is right for you.  If you have had past treatment for cervical cancer or a condition that could lead to cancer, you need Pap tests and screening for cancer for at least 20 years after your treatment. If Pap tests have been discontinued for you, your risk factors (such as having a new sexual partner) need to be reassessed to determine if you should start having screenings again. Some women have medical problems that increase the chance of getting cervical cancer. In these cases, your health care provider may recommend that you have screening and Pap tests more often.  If you have a family history of uterine cancer or ovarian cancer, talk with your health care provider about genetic screening.  If you have vaginal bleeding after reaching menopause, tell your health care provider.  There are currently no reliable tests available to screen for ovarian cancer.  Lung Cancer Lung cancer screening is recommended for adults 57-1 years old who are at high risk for lung cancer because of a history of smoking. A yearly low-dose CT scan of the lungs is recommended if you:  Currently smoke.  Have a history  of at least 30 pack-years of smoking and you currently smoke or have quit within the past 15 years. A pack-year is smoking an average of one pack of cigarettes per day for one year.  Yearly screening should:  Continue until it has been 15 years since you quit.  Stop if you develop a health problem that would prevent you from having lung cancer treatment.  Colorectal Cancer  This type of cancer can be detected and can often be prevented.  Routine colorectal cancer screening usually begins at age 79 and continues through age 51.  If you have risk factors for colon cancer, your health care provider may recommend that you be screened  at an earlier age.  If you have a family history of colorectal cancer, talk with your health care provider about genetic screening.  Your health care provider may also recommend using home test kits to check for hidden blood in your stool.  A small camera at the end of a tube can be used to examine your colon directly (sigmoidoscopy or colonoscopy). This is done to check for the earliest forms of colorectal cancer.  Direct examination of the colon should be repeated every 5-10 years until age 29. However, if early forms of precancerous polyps or small growths are found or if you have a family history or genetic risk for colorectal cancer, you may need to be screened more often.  Skin Cancer  Check your skin from head to toe regularly.  Monitor any moles. Be sure to tell your health care provider: ? About any new moles or changes in moles, especially if there is a change in a mole's shape or color. ? If you have a mole that is larger than the size of a pencil eraser.  If any of your family members has a history of skin cancer, especially at a young age, talk with your health care provider about genetic screening.  Always use sunscreen. Apply sunscreen liberally and repeatedly throughout the day.  Whenever you are outside, protect yourself by wearing long sleeves, pants, a wide-brimmed hat, and sunglasses.  What should I know about osteoporosis? Osteoporosis is a condition in which bone destruction happens more quickly than new bone creation. After menopause, you may be at an increased risk for osteoporosis. To help prevent osteoporosis or the bone fractures that can happen because of osteoporosis, the following is recommended:  If you are 57-70 years old, get at least 1,000 mg of calcium and at least 600 mg of vitamin D per day.  If you are older than age 73 but younger than age 58, get at least 1,200 mg of calcium and at least 600 mg of vitamin D per day.  If you are older than age 64,  get at least 1,200 mg of calcium and at least 800 mg of vitamin D per day.  Smoking and excessive alcohol intake increase the risk of osteoporosis. Eat foods that are rich in calcium and vitamin D, and do weight-bearing exercises several times each week as directed by your health care provider. What should I know about how menopause affects my mental health? Depression may occur at any age, but it is more common as you become older. Common symptoms of depression include:  Low or sad mood.  Changes in sleep patterns.  Changes in appetite or eating patterns.  Feeling an overall lack of motivation or enjoyment of activities that you previously enjoyed.  Frequent crying spells.  Talk with your health care provider  if you think that you are experiencing depression. What should I know about immunizations? It is important that you get and maintain your immunizations. These include:  Tetanus, diphtheria, and pertussis (Tdap) booster vaccine.  Influenza every year before the flu season begins.  Pneumonia vaccine.  Shingles vaccine.  Your health care provider may also recommend other immunizations. This information is not intended to replace advice given to you by your health care provider. Make sure you discuss any questions you have with your health care provider. Document Released: 10/05/2005 Document Revised: 03/02/2016 Document Reviewed: 05/17/2015 Elsevier Interactive Patient Education  2018 Elsevier Inc.  

## 2018-07-28 ENCOUNTER — Other Ambulatory Visit: Payer: Self-pay | Admitting: Obstetrics and Gynecology

## 2018-08-01 ENCOUNTER — Other Ambulatory Visit
Admission: RE | Admit: 2018-08-01 | Discharge: 2018-08-01 | Disposition: A | Discharge status: Home / Self Care | Payer: BC Managed Care – PPO | Source: Ambulatory Visit | Attending: Student | Admitting: Student

## 2018-08-01 DIAGNOSIS — R197 Diarrhea, unspecified: Secondary | ICD-10-CM | POA: Diagnosis not present

## 2018-08-01 LAB — C DIFFICILE QUICK SCREEN W PCR REFLEX
C Diff antigen: NEGATIVE
C Diff interpretation: NOT DETECTED
C Diff toxin: NEGATIVE

## 2018-08-01 LAB — GASTROINTESTINAL PANEL BY PCR, STOOL (REPLACES STOOL CULTURE)

## 2018-08-03 LAB — CALPROTECTIN, FECAL: Calprotectin, Fecal: 46 ug/g (ref 0–120)

## 2018-08-04 ENCOUNTER — Other Ambulatory Visit: Payer: Self-pay | Admitting: Obstetrics and Gynecology

## 2018-10-24 ENCOUNTER — Telehealth: Payer: Self-pay | Admitting: Obstetrics and Gynecology

## 2018-10-24 NOTE — Telephone Encounter (Signed)
Can you please refill. Patient was seen 07/08/2018 by Dr. Algis Downs. She has appointment with you 07/14/2019 for annual.

## 2018-10-24 NOTE — Telephone Encounter (Signed)
The patient called and stated  That she is currently out of the medication estrogens-methylTEST (ESTRATEST) 1.25-2.5 MG tablet [9960] that was helping with her menopause issues. The patient stated that Dr. Tommi Rumps informed her that the prescription could be refilled. Pt is requesting call back for confirmation. Please advise.

## 2018-11-06 ENCOUNTER — Other Ambulatory Visit: Payer: Self-pay | Admitting: Obstetrics and Gynecology

## 2018-11-06 ENCOUNTER — Telehealth: Payer: Self-pay | Admitting: Obstetrics and Gynecology

## 2018-11-06 DIAGNOSIS — Z01419 Encounter for gynecological examination (general) (routine) without abnormal findings: Secondary | ICD-10-CM

## 2018-11-06 MED ORDER — EST ESTROGENS-METHYLTEST 1.25-2.5 MG PO TABS: 1.0000 | ORAL_TABLET | Freq: Every day | ORAL | 1 refills | Status: DC

## 2018-11-06 NOTE — Telephone Encounter (Signed)
Patient called to check on status of script.Just a reminder since the computers were down. Thanks

## 2019-02-13 ENCOUNTER — Encounter: Payer: Self-pay | Admitting: Podiatry

## 2019-02-13 ENCOUNTER — Other Ambulatory Visit: Payer: Self-pay

## 2019-02-13 ENCOUNTER — Ambulatory Visit (INDEPENDENT_AMBULATORY_CARE_PROVIDER_SITE_OTHER): Payer: BC Managed Care – PPO

## 2019-02-13 ENCOUNTER — Ambulatory Visit (INDEPENDENT_AMBULATORY_CARE_PROVIDER_SITE_OTHER): Payer: BC Managed Care – PPO | Admitting: Podiatry

## 2019-02-13 VITALS — Temp 97.8°F

## 2019-02-13 DIAGNOSIS — M2580 Other specified joint disorders, unspecified joint: Secondary | ICD-10-CM | POA: Diagnosis not present

## 2019-02-13 DIAGNOSIS — M7751 Other enthesopathy of right foot: Secondary | ICD-10-CM

## 2019-02-13 DIAGNOSIS — M779 Enthesopathy, unspecified: Secondary | ICD-10-CM

## 2019-02-13 MED ORDER — MELOXICAM 15 MG PO TABS: 15.0000 mg | ORAL_TABLET | Freq: Every day | ORAL | 1 refills | Status: DC

## 2019-02-13 MED ORDER — METHYLPREDNISOLONE 4 MG PO TBPK: ORAL_TABLET | ORAL | 0 refills | Status: DC

## 2019-02-15 NOTE — Progress Notes (Signed)
   HPI: 58 year old female presenting today for follow up evaluation of fibular sesamoiditis of the right foot. She states the pain flared up 5-6 days ago on the ball of the right foot. Walking and moving the right hallux increases the pain. She has not done anything at home for treatment. Patient is here for further evaluation and treatment.   Past Medical History:  Diagnosis Date  . Decreased libido   . Family history of ovarian cancer   . Prediabetes   . Status post vaginal hysterectomy   . Stress fracture of right foot   . SUI (stress urinary incontinence, female)   . Surgical menopause   . Vulvar abscess 09/10/2014   acute and chronic inflammation with granulation tissue     Physical Exam: General: The patient is alert and oriented x3 in no acute distress.  Dermatology: Skin is warm, dry and supple bilateral lower extremities. Negative for open lesions or macerations.  Vascular: Palpable pedal pulses bilaterally. No edema or erythema noted. Capillary refill within normal limits.  Neurological: Epicritic and protective threshold grossly intact bilaterally.   Musculoskeletal Exam: Pain with palpation to the right fibular sesamoid apparatus. Range of motion within normal limits to all pedal and ankle joints bilateral. Muscle strength 5/5 in all groups bilateral.   Radiographic Exam:  Evidence of old fibular sesamoid fracture.    Assessment: 1. Fibular sesamoiditis right    Plan of Care:  1. Patient evaluated. X-Rays reviewed.  2. Injection of 0.5 mLs of Celestone Soluspan injected into the sesamoidal apparatus. 3. Prescription for Medrol Dose Pak provided to patient. 4. Prescription for Meloxicam provided to patient. 5. Post op shoe dispensed.  6. Return to clinic in 4 weeks.      Edrick Kins, DPM [facility]  Dr. Edrick Kins, DPM    [address], [place] [address]                Office [phone]  Fax [phone]

## 2019-02-20 ENCOUNTER — Ambulatory Visit: Payer: BC Managed Care – PPO | Admitting: Podiatry

## 2019-03-13 ENCOUNTER — Ambulatory Visit: Payer: BC Managed Care – PPO | Admitting: Podiatry

## 2019-05-18 ENCOUNTER — Telehealth: Payer: Self-pay | Admitting: Obstetrics and Gynecology

## 2019-05-18 DIAGNOSIS — K52832 Lymphocytic colitis: Secondary | ICD-10-CM | POA: Insufficient documentation

## 2019-05-18 DIAGNOSIS — Z01419 Encounter for gynecological examination (general) (routine) without abnormal findings: Secondary | ICD-10-CM

## 2019-05-18 NOTE — Telephone Encounter (Signed)
The patient called and stated that she needs a refill of the medication estrogens-methylTEST (ESTRATEST) 1.25-2.5 MG tablet [9960] sent to her pharmacy. Please advise.

## 2019-05-19 ENCOUNTER — Encounter: Payer: Self-pay | Admitting: Surgical

## 2019-05-19 NOTE — Telephone Encounter (Signed)
Called and left patient a message and sent her a my chart message letting her know that Dr. Amalia Hailey will not be in the office until Friday. He will have to send in this prescription.

## 2019-05-19 NOTE — Telephone Encounter (Signed)
Please fill. Control substance I can't send in.

## 2019-05-21 DIAGNOSIS — E1169 Type 2 diabetes mellitus with other specified complication: Secondary | ICD-10-CM | POA: Insufficient documentation

## 2019-05-21 DIAGNOSIS — E7800 Pure hypercholesterolemia, unspecified: Secondary | ICD-10-CM | POA: Insufficient documentation

## 2019-05-21 MED ORDER — EST ESTROGENS-METHYLTEST 1.25-2.5 MG PO TABS: 1.0000 | ORAL_TABLET | Freq: Every day | ORAL | 0 refills | Status: DC

## 2019-06-16 ENCOUNTER — Ambulatory Visit (INDEPENDENT_AMBULATORY_CARE_PROVIDER_SITE_OTHER): Payer: BC Managed Care – PPO | Admitting: Podiatry

## 2019-06-16 ENCOUNTER — Other Ambulatory Visit: Payer: Self-pay

## 2019-06-16 ENCOUNTER — Encounter

## 2019-06-16 DIAGNOSIS — M2580 Other specified joint disorders, unspecified joint: Secondary | ICD-10-CM | POA: Diagnosis not present

## 2019-06-16 DIAGNOSIS — M7751 Other enthesopathy of right foot: Secondary | ICD-10-CM

## 2019-06-16 MED ORDER — MELOXICAM 15 MG PO TABS: 15.0000 mg | ORAL_TABLET | Freq: Every day | ORAL | 1 refills | Status: DC

## 2019-06-16 MED ORDER — METHYLPREDNISOLONE 4 MG PO TBPK: ORAL_TABLET | ORAL | 0 refills | Status: DC

## 2019-06-16 NOTE — Patient Instructions (Signed)
Pre-Operative Instructions  Congratulations, you have decided to take an important step towards improving your quality of life.  You can be assured that the doctors and staff at [facility] will be with you every step of the way.  Here are some important things you should know:  1. Plan to be at the surgery center/hospital at least 1 (one) hour prior to your scheduled time, unless otherwise directed by the surgical center/hospital staff.  You must have a responsible adult accompany you, remain during the surgery and drive you home.  Make sure you have directions to the surgical center/hospital to ensure you arrive on time. 2. If you are having surgery at [facility] or [facility] hospitals, you will need a copy of your medical history and physical form from your family physician within one month prior to the date of surgery. We will give you a form for your primary physician to complete.  3. We make every effort to accommodate the date you request for surgery.  However, there are times where surgery dates or times have to be moved.  We will contact you as soon as possible if a change in schedule is required.   4. No aspirin/ibuprofen for one week before surgery.  If you are on aspirin, any non-steroidal anti-inflammatory medications (Mobic, Aleve, Ibuprofen) should not be taken seven (7) days prior to your surgery.  You make take Tylenol for pain prior to surgery.  5. Medications - If you are taking daily heart and blood pressure medications, seizure, reflux, allergy, asthma, anxiety, pain or diabetes medications, make sure you notify the surgery center/hospital before the day of surgery so they can tell you which medications you should take or avoid the day of surgery. 6. No food or drink after midnight the night before surgery unless directed otherwise by surgical center/hospital staff. 7. No alcoholic beverages 24-hours prior to surgery.  No smoking 24-hours prior or 24-hours after  surgery. 8. Wear loose pants or shorts. They should be loose enough to fit over bandages, boots, and casts. 9. Don't wear slip-on shoes. Sneakers are preferred. 10. Bring your boot with you to the surgery center/hospital.  Also bring crutches or a walker if your physician has prescribed it for you.  If you do not have this equipment, it will be provided for you after surgery. 11. If you have not been contacted by the surgery center/hospital by the day before your surgery, call to confirm the date and time of your surgery. 12. Leave-time from work may vary depending on the type of surgery you have.  Appropriate arrangements should be made prior to surgery with your employer. 13. Prescriptions will be provided immediately following surgery by your doctor.  Fill these as soon as possible after surgery and take the medication as directed. Pain medications will not be refilled on weekends and must be approved by the doctor. 14. Remove nail polish on the operative foot and avoid getting pedicures prior to surgery. 15. Wash the night before surgery.  The night before surgery wash the foot and leg well with water and the antibacterial soap provided. Be sure to pay special attention to beneath the toenails and in between the toes.  Wash for at least three (3) minutes. Rinse thoroughly with water and dry well with a towel.  Perform this wash unless told not to do so by your physician.  Enclosed: 1 Ice pack (please put in freezer the night before surgery)   1 Hibiclens skin cleaner     Pre-op instructions  If you have any questions regarding the instructions, please do not hesitate to call our office.  [place]: [address], [place], [place] [address] -- [phone]  [place]: [address] [place], [place] [address] -- [phone]  [place]: [address]  [place], [place] [address] -- [phone]   Website: [email or web address] 

## 2019-06-17 ENCOUNTER — Telehealth: Payer: Self-pay | Admitting: *Deleted

## 2019-06-17 NOTE — Telephone Encounter (Signed)
"  I saw Dr. Amalia Hailey in the [facility] office yesterday.  He told me to call you to set up my surgery.  I've got to have a bone taken out of my foot.  Call me back."

## 2019-06-18 NOTE — Telephone Encounter (Signed)
I am returning your call.  You want to schedule your surgery?  "Yes, I would.  He told me he was about six weeks out."  Yes, that is correct but he had a cancellation for July 02, 2019, if you would like that date.  "That would be great.  I don't want to put this off until December.  I'll take it."  I'll get it scheduled.  Someone from the surgical center will give you a call a day or two prior to your surgery date and will give you your arrival time.  You need to go online and register using the surgical center's portal, the instructions are in the brochure that we gave you.  "Okay, I'll take care of that."

## 2019-06-18 NOTE — Telephone Encounter (Signed)
"  I called yesterday and left a message.  I need to schedule surgery with Dr. Amalia Hailey.  If you can, call me back."

## 2019-06-19 NOTE — Progress Notes (Signed)
   HPI: 58 year old female presenting today for follow up evaluation of fibular sesamoiditis of the right foot. She reports intermittent numbness and tingling of the right forefoot that began about one week ago. She reports an increases in the pain as well. She has not has any recent treatment but has been taking OTC NSAIDs for relief. Walking and being on the foot increases the pain. Patient is here for further evaluation and treatment.   Past Medical History:  Diagnosis Date  . Decreased libido   . Family history of ovarian cancer   . Prediabetes   . Status post vaginal hysterectomy   . Stress fracture of right foot   . SUI (stress urinary incontinence, female)   . Surgical menopause   . Vulvar abscess 09/10/2014   acute and chronic inflammation with granulation tissue     Physical Exam: General: The patient is alert and oriented x3 in no acute distress.  Dermatology: Skin is warm, dry and supple bilateral lower extremities. Negative for open lesions or macerations.  Vascular: Palpable pedal pulses bilaterally. No edema or erythema noted. Capillary refill within normal limits.  Neurological: Epicritic and protective threshold grossly intact bilaterally.   Musculoskeletal Exam: Pain with palpation to the right fibular sesamoid apparatus. Range of motion within normal limits to all pedal and ankle joints bilateral. Muscle strength 5/5 in all groups bilateral.      Assessment: 1. Fibular sesamoiditis right  2. H/o sesamoid fracture    Plan of Care:  1. Patient evaluated.  2. Injection of 0.5 mLs of Celestone Soluspan injected into the sesamoidal apparatus. 3. Prescription for Medrol Dose Pak provided to patient. 4. Prescription for Meloxicam provided to patient. 5. 2. Today we discussed the conservative versus surgical management of the presenting pathology. The patient opts for surgical management. All possible complications and details of the procedure were explained. All  patient questions were answered. No guarantees were expressed or implied. 6. Authorization for surgery was initiated today. Surgery will consist of fibular sesamoidectomy right.  7. Return to clinic one week post op.      Edrick Kins, DPM [facility]  Dr. Edrick Kins, DPM    [address], [place] [address]                Office [phone]  Fax [phone]

## 2019-06-23 ENCOUNTER — Ambulatory Visit: Payer: BC Managed Care – PPO | Admitting: Podiatry

## 2019-06-25 ENCOUNTER — Telehealth: Payer: Self-pay | Admitting: *Deleted

## 2019-06-25 NOTE — Telephone Encounter (Signed)
DOS 07/02/2019 SESAMOIDECTOMY RT - 28315  BCBS: Eligibility Date -  08/27/2018  -  08/26/9998  In-Network   Max Per Benefit Period Year-to-Date Remaining     CoInsurance 20%      Deductible $1250.00 $0.00     Out-Of-Pocket  $4890.00 $2640.62  AMBULATORY SURGERY In Network Copay Coinsurance Not Applicable 81%  per  [place]

## 2019-07-02 ENCOUNTER — Other Ambulatory Visit: Payer: Self-pay | Admitting: Podiatry

## 2019-07-02 DIAGNOSIS — M84871 Other disorders of continuity of bone, right ankle and foot: Secondary | ICD-10-CM

## 2019-07-02 MED ORDER — OXYCODONE-ACETAMINOPHEN 5-325 MG PO TABS: 1.0000 | ORAL_TABLET | ORAL | 0 refills | Status: DC | PRN

## 2019-07-02 NOTE — Progress Notes (Signed)
.  postop

## 2019-07-10 ENCOUNTER — Ambulatory Visit (INDEPENDENT_AMBULATORY_CARE_PROVIDER_SITE_OTHER): Payer: BC Managed Care – PPO

## 2019-07-10 ENCOUNTER — Encounter: Payer: Self-pay | Admitting: Podiatry

## 2019-07-10 ENCOUNTER — Other Ambulatory Visit: Payer: Self-pay

## 2019-07-10 ENCOUNTER — Ambulatory Visit (INDEPENDENT_AMBULATORY_CARE_PROVIDER_SITE_OTHER): Payer: BC Managed Care – PPO | Admitting: Podiatry

## 2019-07-10 VITALS — BP 130/81 | HR 85 | Temp 97.8°F

## 2019-07-10 DIAGNOSIS — M2580 Other specified joint disorders, unspecified joint: Secondary | ICD-10-CM

## 2019-07-10 DIAGNOSIS — Z9889 Other specified postprocedural states: Secondary | ICD-10-CM

## 2019-07-13 NOTE — Progress Notes (Signed)
   Subjective:  Patient presents today status post sesamoidectomy right. DOS: 07/02/2019. She states she is doing well. She denies any significant pain or modifying factors. She has been using the CAM boot as directed. Patient is here for further evaluation and treatment.    Past Medical History:  Diagnosis Date  . Decreased libido   . Family history of ovarian cancer   . Prediabetes   . Status post vaginal hysterectomy   . Stress fracture of right foot   . SUI (stress urinary incontinence, female)   . Surgical menopause   . Vulvar abscess 09/10/2014   acute and chronic inflammation with granulation tissue      Objective/Physical Exam Neurovascular status intact.  Skin incisions appear to be well coapted with sutures and staples intact. No sign of infectious process noted. No dehiscence. No active bleeding noted. Moderate edema noted to the surgical extremity.  Radiographic Exam:  Absence of fibular sesamoid noted. Normal osseous mineralization. Joint spaces preserved. No fracture/dislocation/boney destruction.     Assessment: 1. s/p sesamoidectomy right. DOS: 07/02/2019   Plan of Care:  1. Patient was evaluated. X-rays reviewed 2. Dressing changed.  3. Discontinue using CAM boot. Resume using post op shoe.  4. Return to clinic in 3 weeks for suture removal.    Edrick Kins, DPM [facility]  Dr. Edrick Kins, [address] [place]                                        [place], [place] [address]                Office [phone]  Fax [phone]

## 2019-07-14 ENCOUNTER — Encounter: Payer: BC Managed Care – PPO | Admitting: Obstetrics and Gynecology

## 2019-07-17 ENCOUNTER — Other Ambulatory Visit: Payer: BC Managed Care – PPO

## 2019-07-28 ENCOUNTER — Ambulatory Visit (INDEPENDENT_AMBULATORY_CARE_PROVIDER_SITE_OTHER): Payer: BC Managed Care – PPO | Admitting: Podiatry

## 2019-07-28 ENCOUNTER — Encounter

## 2019-07-28 ENCOUNTER — Other Ambulatory Visit: Payer: Self-pay

## 2019-07-28 DIAGNOSIS — Z9889 Other specified postprocedural states: Secondary | ICD-10-CM

## 2019-07-28 DIAGNOSIS — M2580 Other specified joint disorders, unspecified joint: Secondary | ICD-10-CM

## 2019-07-30 ENCOUNTER — Encounter: Payer: Self-pay | Admitting: Podiatry

## 2019-07-31 NOTE — Progress Notes (Signed)
   Subjective:  Patient presents today status post sesamoidectomy right. DOS: 07/02/2019. She states she is doing well. She denies any pain or modifying factors. She has been using the post op shoe as directed. Patient is here for further evaluation and treatment.    Past Medical History:  Diagnosis Date  . Decreased libido   . Family history of ovarian cancer   . Prediabetes   . Status post vaginal hysterectomy   . Stress fracture of right foot   . SUI (stress urinary incontinence, female)   . Surgical menopause   . Vulvar abscess 09/10/2014   acute and chronic inflammation with granulation tissue      Objective/Physical Exam Neurovascular status intact.  Skin incisions appear to be well coapted with sutures and staples intact. No sign of infectious process noted. No dehiscence. No active bleeding noted. Moderate edema noted to the surgical extremity.  Assessment: 1. s/p sesamoidectomy right. DOS: 07/02/2019   Plan of Care:  1. Patient was evaluated.  2. Sutures removed.  3. Continue using post op shoe.  4. Return to clinic in 4 weeks.    Edrick Kins, DPM [facility]  Dr. Edrick Kins, [address]                                        [place], [place] [address]                Office [phone]  Fax [phone]

## 2019-09-01 ENCOUNTER — Ambulatory Visit: Payer: BC Managed Care – PPO | Attending: Internal Medicine

## 2019-09-01 ENCOUNTER — Other Ambulatory Visit: Payer: BC Managed Care – PPO

## 2019-09-01 DIAGNOSIS — Z20822 Contact with and (suspected) exposure to covid-19: Secondary | ICD-10-CM

## 2019-09-03 ENCOUNTER — Telehealth: Payer: Self-pay

## 2019-09-03 LAB — NOVEL CORONAVIRUS, NAA: SARS-CoV-2, NAA: DETECTED — AB

## 2019-09-03 NOTE — Telephone Encounter (Signed)
Patient called with her husband.  She was informed that her test for the COVID-19 09/01/19 was positive and she can pass the germ to others.  Symptom tier and treatment plans for each were read. Criteria for ending isolation were read. Good preventative practices were reviewed.  She verbalized understanding of all information. [place] will be notified.

## 2019-09-18 ENCOUNTER — Ambulatory Visit: Payer: BC Managed Care – PPO

## 2019-09-18 ENCOUNTER — Encounter: Payer: Self-pay | Admitting: Podiatry

## 2019-09-18 ENCOUNTER — Ambulatory Visit (INDEPENDENT_AMBULATORY_CARE_PROVIDER_SITE_OTHER): Payer: BC Managed Care – PPO

## 2019-09-18 ENCOUNTER — Other Ambulatory Visit: Payer: Self-pay

## 2019-09-18 ENCOUNTER — Ambulatory Visit (INDEPENDENT_AMBULATORY_CARE_PROVIDER_SITE_OTHER): Payer: BC Managed Care – PPO | Admitting: Podiatry

## 2019-09-18 DIAGNOSIS — M2580 Other specified joint disorders, unspecified joint: Secondary | ICD-10-CM

## 2019-09-18 DIAGNOSIS — Z9889 Other specified postprocedural states: Secondary | ICD-10-CM

## 2019-09-20 NOTE — Progress Notes (Signed)
   Subjective:  Patient presents today status post sesamoidectomy right. DOS: 07/02/2019. She states she is doing well. She denies any pain or modifying factors. She has been using the post op shoe as directed. Patient is here for further evaluation and treatment.    Past Medical History:  Diagnosis Date  . Decreased libido   . Family history of ovarian cancer   . Prediabetes   . Status post vaginal hysterectomy   . Stress fracture of right foot   . SUI (stress urinary incontinence, female)   . Surgical menopause   . Vulvar abscess 09/10/2014   acute and chronic inflammation with granulation tissue      Objective/Physical Exam Neurovascular status intact.  Skin incisions appear to be well coapted. No sign of infectious process noted. No dehiscence. No active bleeding noted. Moderate edema noted to the surgical extremity.  Radiographic Exam:  Absence of fibular sesamoid noted. Normal osseous mineralization. Joint spaces preserved. No fracture/dislocation/boney destruction.    Assessment: 1. s/p sesamoidectomy right. DOS: 07/02/2019   Plan of Care:  1. Patient was evaluated. X-Rays reviewed.  2. May resume full activity with no restrictions.  3. Recommended good shoe gear.  4. Return to clinic as needed.    Felecia Shelling, DPM [facility]  Dr. Felecia Shelling, DPM    [address]                                        [place], [place] [address]                Office [phone]  Fax [phone]

## 2020-03-23 ENCOUNTER — Other Ambulatory Visit: Payer: Self-pay | Admitting: Physical Medicine & Rehabilitation

## 2020-03-23 DIAGNOSIS — M5412 Radiculopathy, cervical region: Secondary | ICD-10-CM

## 2020-03-24 ENCOUNTER — Ambulatory Visit
Admission: RE | Admit: 2020-03-24 | Discharge: 2020-03-24 | Disposition: A | Discharge status: Home / Self Care | Payer: BC Managed Care – PPO | Source: Ambulatory Visit | Attending: Physical Medicine & Rehabilitation | Admitting: Physical Medicine & Rehabilitation

## 2020-03-24 ENCOUNTER — Other Ambulatory Visit: Payer: Self-pay

## 2020-03-24 DIAGNOSIS — M5412 Radiculopathy, cervical region: Secondary | ICD-10-CM | POA: Insufficient documentation

## 2020-03-25 DIAGNOSIS — M542 Cervicalgia: Secondary | ICD-10-CM | POA: Insufficient documentation

## 2020-03-25 DIAGNOSIS — M5010 Cervical disc disorder with radiculopathy, unspecified cervical region: Secondary | ICD-10-CM | POA: Insufficient documentation

## 2020-03-25 DIAGNOSIS — M4802 Spinal stenosis, cervical region: Secondary | ICD-10-CM | POA: Insufficient documentation

## 2020-04-14 ENCOUNTER — Other Ambulatory Visit: Payer: Self-pay | Admitting: Neurosurgery

## 2020-04-18 ENCOUNTER — Encounter
Admission: RE | Admit: 2020-04-18 | Discharge: 2020-04-18 | Disposition: A | Discharge status: Home / Self Care | Payer: BC Managed Care – PPO | Source: Ambulatory Visit | Attending: Neurosurgery | Admitting: Neurosurgery

## 2020-04-18 ENCOUNTER — Encounter: Payer: Self-pay | Admitting: Neurosurgery

## 2020-04-18 ENCOUNTER — Other Ambulatory Visit
Admission: RE | Admit: 2020-04-18 | Discharge: 2020-04-18 | Disposition: A | Discharge status: Home / Self Care | Payer: BC Managed Care – PPO | Source: Ambulatory Visit | Attending: Neurosurgery | Admitting: Neurosurgery

## 2020-04-18 ENCOUNTER — Other Ambulatory Visit: Payer: Self-pay

## 2020-04-18 DIAGNOSIS — Z20822 Contact with and (suspected) exposure to covid-19: Secondary | ICD-10-CM | POA: Insufficient documentation

## 2020-04-18 DIAGNOSIS — Z01812 Encounter for preprocedural laboratory examination: Secondary | ICD-10-CM | POA: Diagnosis present

## 2020-04-18 LAB — URINALYSIS, ROUTINE W REFLEX MICROSCOPIC
Bacteria, UA: NONE SEEN
Bilirubin Urine: NEGATIVE
Glucose, UA: NEGATIVE mg/dL
Hgb urine dipstick: NEGATIVE
Ketones, ur: NEGATIVE mg/dL
Nitrite: NEGATIVE
Protein, ur: NEGATIVE mg/dL
Specific Gravity, Urine: 1.016 (ref 1.005–1.030)
Squamous Epithelial / HPF: NONE SEEN (ref 0–5)
pH: 6 (ref 5.0–8.0)

## 2020-04-18 LAB — BASIC METABOLIC PANEL WITH GFR
Anion gap: 8 (ref 5–15)
BUN: 19 mg/dL (ref 6–20)
CO2: 32 mmol/L (ref 22–32)
Calcium: 10.2 mg/dL (ref 8.9–10.3)
Chloride: 106 mmol/L (ref 98–111)
Creatinine, Ser: 0.79 mg/dL (ref 0.44–1.00)
GFR calc Af Amer: >60 mL/min
GFR calc non Af Amer: >60 mL/min
Glucose, Bld: 96 mg/dL (ref 70–99)
Potassium: 4.8 mmol/L (ref 3.5–5.1)
Sodium: 146 mmol/L — ABNORMAL HIGH (ref 135–145)

## 2020-04-18 LAB — CBC
HCT: 45.7 % (ref 36.0–46.0)
Hemoglobin: 15.3 g/dL — ABNORMAL HIGH (ref 12.0–15.0)
MCH: 31.6 pg (ref 26.0–34.0)
MCHC: 33.5 g/dL (ref 30.0–36.0)
MCV: 94.4 fL (ref 80.0–100.0)
Platelets: 365 10*3/uL (ref 150–400)
RBC: 4.84 MIL/uL (ref 3.87–5.11)
RDW: 13.8 % (ref 11.5–15.5)
WBC: 7.9 10*3/uL (ref 4.0–10.5)
nRBC: 0 % (ref 0.0–0.2)

## 2020-04-18 LAB — PROTIME-INR
INR: 0.9 (ref 0.8–1.2)
Prothrombin Time: 12.1 s (ref 11.4–15.2)

## 2020-04-18 LAB — TYPE AND SCREEN
ABO/RH(D): A POS
Antibody Screen: NEGATIVE

## 2020-04-18 LAB — APTT: aPTT: 26 s (ref 24–36)

## 2020-04-18 LAB — SURGICAL PCR SCREEN
MRSA, PCR: NEGATIVE
Staphylococcus aureus: POSITIVE — AB

## 2020-04-18 NOTE — Progress Notes (Signed)
  [facility] Perioperative Services: Pre-Admission/Anesthesia Testing  Abnormal Lab Notification  Date: 04/18/20  Name: LISSETT FAVORITE MRN:   [id number]  Re: Abnormal labs noted during PAT appointment  Provider Notified: Venetia Night, MD Notification mode: Routed and/or faxed via [facility]  Labs of concern: Lab Results  Component Value Date   STAPHAUREUS POSITIVE (A) 04/18/2020   MRSAPCR NEGATIVE 04/18/2020    Notes: Patient is scheduled for a C5-7 ACDF on 04/20/2020. This is a Personal assistant; no formal response required.   Quentin Mulling, MSN, APRN, FNP-C, CEN [facility]  Peri-operative Services Nurse Practitioner Phone: [phone] 04/18/20 3:47 PM

## 2020-04-18 NOTE — Patient Instructions (Signed)
Your procedure is scheduled on: 04/20/20 Report to DAY SURGERY DEPARTMENT LOCATED ON 2ND FLOOR MEDICAL MALL ENTRANCE. To find out your arrival time please call [phone] between 1PM - 3PM on 04/19/20.  Remember: Instructions that are not followed completely may result in serious medical risk, up to and including death, or upon the discretion of your surgeon and anesthesiologist your surgery may need to be rescheduled.     _X__ 1. Do not eat food after midnight the night before your procedure.                 No gum chewing or hard candies. You may drink clear liquids up to 2 hours                 before you are scheduled to arrive for your surgery- DO not drink clear                 liquids within 2 hours of the start of your surgery.                 Clear Liquids include:  water, apple juice without pulp, clear carbohydrate                 drink such as Clearfast or Gatorade, Black Coffee or Tea (Do not add                 anything to coffee or tea). Diabetics water only  __X__2.  On the morning of surgery brush your teeth with toothpaste and water, you                 may rinse your mouth with mouthwash if you wish.  Do not swallow any              toothpaste of mouthwash.     _X__ 3.  No Alcohol for 24 hours before or after surgery.   _X__ 4.  Do Not Smoke or use e-cigarettes For 24 Hours Prior to Your Surgery.                 Do not use any chewable tobacco products for at least 6 hours prior to                 surgery.  ____  5.  Bring all medications with you on the day of surgery if instructed.   __X__  6.  Notify your doctor if there is any change in your medical condition      (cold, fever, infections).     Do not wear jewelry, make-up, hairpins, clips or nail polish. Do not wear lotions, powders, or perfumes.  Do not shave 48 hours prior to surgery. Men may shave face and neck. Do not bring valuables to the hospital.    [facility] is not responsible for any belongings or  valuables.  Contacts, dentures/partials or body piercings may not be worn into surgery. Bring a case for your contacts, glasses or hearing aids, a denture cup will be supplied. Leave your suitcase in the car. After surgery it may be brought to your room. For patients admitted to the hospital, discharge time is determined by your treatment team.   Patients discharged the day of surgery will not be allowed to drive home.   Please read over the following fact sheets that you were given:   MRSA Information  __X__ Take these medicines the morning of surgery with A SIP OF WATER:  1. gabapentin  2.   3.   4.  5.  6.  ____ Fleet Enema (as directed)   __X__ Use CHG Soap/SAGE wipes as directed  ____ Use inhalers on the day of surgery  ____ Stop metformin/Janumet/Farxiga 2 days prior to surgery    ____ Take 1/2 of usual insulin dose the night before surgery. No insulin the morning          of surgery.   ____ Stop Blood Thinners Coumadin/Plavix/Xarelto/Pleta/Pradaxa/Eliquis/Effient/Aspirin  on   Or contact your Surgeon, Cardiologist or Medical Doctor regarding  ability to stop your blood thinners  __X__ Stop Anti-inflammatories 7 days before surgery such as Advil, Ibuprofen, Motrin,  BC or Goodies Powder, Naprosyn, Naproxen, Aleve, Aspirin    __X__ Stop all herbal supplements, fish oil or vitamin E until after surgery.    ____ Bring C-Pap to the hospital.     How to Use Chlorhexidine for Bathing Chlorhexidine gluconate (CHG) is a germ-killing (antiseptic) solution that is used to clean the skin. It can get rid of the bacteria that normally live on the skin and can keep them away for about 24 hours. To clean your skin with CHG, you may be given:  A CHG solution to use in the shower or as part of a sponge bath.  A prepackaged cloth that contains CHG. Cleaning your skin with CHG may help lower the risk for infection:  While you are staying in the intensive care unit of the  hospital.  If you have a vascular access, such as a central line, to provide short-term or long-term access to your veins.  If you have a catheter to drain urine from your bladder.  If you are on a ventilator. A ventilator is a machine that helps you breathe by moving air in and out of your lungs.  After surgery. What are the risks? Risks of using CHG include:  A skin reaction.  Hearing loss, if CHG gets in your ears.  Eye injury, if CHG gets in your eyes and is not rinsed out.  The CHG product catching fire. Make sure that you avoid smoking and flames after applying CHG to your skin. Do not use CHG:  If you have a chlorhexidine allergy or have previously reacted to chlorhexidine.  On babies younger than 91 months of age. How to use CHG solution  Use CHG only as told by your health care provider, and follow the instructions on the label.  Use the full amount of CHG as directed. Usually, this is one bottle. During a shower Follow these steps when using CHG solution during a shower (unless your health care provider gives you different instructions): 1. Start the shower. 2. Use your normal soap and shampoo to wash your face and hair. 3. Turn off the shower or move out of the shower stream. 4. Pour the CHG onto a clean washcloth. Do not use any type of brush or rough-edged sponge. 5. Starting at your neck, lather your body down to your toes. Make sure you follow these instructions: ? If you will be having surgery, pay special attention to the part of your body where you will be having surgery. Scrub this area for at least 1 minute. ? Do not use CHG on your head or face. If the solution gets into your ears or eyes, rinse them well with water. ? Avoid your genital area. ? Avoid any areas of skin that have broken skin, cuts, or scrapes. ? Scrub your back and  under your arms. Make sure to wash skin folds. 6. Let the lather sit on your skin for 1-2 minutes or as long as told by your  health care provider. 7. Thoroughly rinse your entire body in the shower. Make sure that all body creases and crevices are rinsed well. 8. Dry off with a clean towel. Do not put any substances on your body afterward--such as powder, lotion, or perfume--unless you are told to do so by your health care provider. Only use lotions that are recommended by the manufacturer. 9. Put on clean clothes or pajamas. 10. If it is the night before your surgery, sleep in clean sheets. 11. Repeat the morning of surgery

## 2020-04-19 LAB — SARS CORONAVIRUS 2 (TAT 6-24 HRS): SARS Coronavirus 2: NEGATIVE

## 2020-04-20 ENCOUNTER — Other Ambulatory Visit: Payer: Self-pay

## 2020-04-20 ENCOUNTER — Ambulatory Visit: Payer: BC Managed Care – PPO

## 2020-04-20 ENCOUNTER — Encounter
Admission: RE | Disposition: A | Discharge status: Home / Self Care | Payer: Self-pay | Source: Home / Self Care | Attending: Neurosurgery

## 2020-04-20 ENCOUNTER — Ambulatory Visit: Payer: BC Managed Care – PPO | Admitting: Certified Registered Nurse Anesthetist

## 2020-04-20 ENCOUNTER — Encounter: Payer: Self-pay | Admitting: Neurosurgery

## 2020-04-20 ENCOUNTER — Ambulatory Visit
Admission: RE | Admit: 2020-04-20 | Discharge: 2020-04-20 | Disposition: A | Discharge status: Home / Self Care | Payer: BC Managed Care – PPO | Attending: Neurosurgery | Admitting: Neurosurgery

## 2020-04-20 DIAGNOSIS — Z791 Long term (current) use of non-steroidal anti-inflammatories (NSAID): Secondary | ICD-10-CM | POA: Diagnosis not present

## 2020-04-20 DIAGNOSIS — Z79890 Hormone replacement therapy (postmenopausal): Secondary | ICD-10-CM | POA: Insufficient documentation

## 2020-04-20 DIAGNOSIS — M5412 Radiculopathy, cervical region: Secondary | ICD-10-CM | POA: Diagnosis present

## 2020-04-20 DIAGNOSIS — Z419 Encounter for procedure for purposes other than remedying health state, unspecified: Secondary | ICD-10-CM

## 2020-04-20 DIAGNOSIS — Z09 Encounter for follow-up examination after completed treatment for conditions other than malignant neoplasm: Secondary | ICD-10-CM

## 2020-04-20 DIAGNOSIS — Z79899 Other long term (current) drug therapy: Secondary | ICD-10-CM | POA: Diagnosis not present

## 2020-04-20 DIAGNOSIS — M4802 Spinal stenosis, cervical region: Secondary | ICD-10-CM | POA: Insufficient documentation

## 2020-04-20 HISTORY — DX: Prediabetes: R73.03

## 2020-04-20 HISTORY — PX: ANTERIOR CERVICAL DECOMP/DISCECTOMY FUSION: SHX1161

## 2020-04-20 LAB — ABO/RH: ABO/RH(D): A POS

## 2020-04-20 SURGERY — ANTERIOR CERVICAL DECOMPRESSION/DISCECTOMY FUSION 2 LEVELS
Anesthesia: General

## 2020-04-20 MED ORDER — ORAL CARE MOUTH RINSE: 15.0000 mL | Freq: Once | OROMUCOSAL | Status: AC

## 2020-04-20 MED ORDER — EPHEDRINE SULFATE 50 MG/ML IJ SOLN
INTRAMUSCULAR | Status: DC | PRN
  Administered 2020-04-20: 5 mg via INTRAVENOUS
  Administered 2020-04-20: 10 mg via INTRAVENOUS

## 2020-04-20 MED ORDER — PROMETHAZINE HCL 25 MG/ML IJ SOLN: 6.2500 mg | INTRAMUSCULAR | Status: DC | PRN

## 2020-04-20 MED ORDER — CEFAZOLIN SODIUM-DEXTROSE 2-4 GM/100ML-% IV SOLN
INTRAVENOUS | Status: AC
  Filled 2020-04-20: qty 100

## 2020-04-20 MED ORDER — LABETALOL HCL 5 MG/ML IV SOLN
5.0000 mg | INTRAVENOUS | Status: DC | PRN
  Administered 2020-04-20: 5 mg via INTRAVENOUS

## 2020-04-20 MED ORDER — MIDAZOLAM HCL 2 MG/2ML IJ SOLN
INTRAMUSCULAR | Status: AC
  Filled 2020-04-20: qty 2

## 2020-04-20 MED ORDER — PROPOFOL 10 MG/ML IV BOLUS
INTRAVENOUS | Status: AC
  Filled 2020-04-20: qty 20

## 2020-04-20 MED ORDER — ROCURONIUM BROMIDE 10 MG/ML (PF) SYRINGE
PREFILLED_SYRINGE | INTRAVENOUS | Status: AC
  Filled 2020-04-20: qty 10

## 2020-04-20 MED ORDER — THROMBIN 5000 UNITS EX SOLR
CUTANEOUS | Status: DC | PRN
  Administered 2020-04-20: 5000 [IU] via TOPICAL

## 2020-04-20 MED ORDER — CHLORHEXIDINE GLUCONATE 0.12 % MT SOLN: 15.0000 mL | Freq: Once | OROMUCOSAL | Status: AC

## 2020-04-20 MED ORDER — ACETAMINOPHEN 10 MG/ML IV SOLN
INTRAVENOUS | Status: AC
  Filled 2020-04-20: qty 100

## 2020-04-20 MED ORDER — MIDAZOLAM HCL 2 MG/2ML IJ SOLN
INTRAMUSCULAR | Status: DC | PRN
  Administered 2020-04-20: 2 mg via INTRAVENOUS

## 2020-04-20 MED ORDER — SODIUM CHLORIDE 0.9 % IV SOLN
INTRAVENOUS | Status: DC | PRN
  Administered 2020-04-20: 40 ug/min via INTRAVENOUS

## 2020-04-20 MED ORDER — REMIFENTANIL HCL 1 MG IV SOLR
INTRAVENOUS | Status: AC
  Filled 2020-04-20: qty 1000

## 2020-04-20 MED ORDER — FAMOTIDINE 20 MG PO TABS: 20.0000 mg | ORAL_TABLET | Freq: Once | ORAL | Status: AC

## 2020-04-20 MED ORDER — LIDOCAINE HCL (CARDIAC) PF 100 MG/5ML IV SOSY
PREFILLED_SYRINGE | INTRAVENOUS | Status: DC | PRN
  Administered 2020-04-20: 60 mg via INTRAVENOUS

## 2020-04-20 MED ORDER — METHOCARBAMOL 500 MG PO TABS
ORAL_TABLET | ORAL | Status: AC
  Filled 2020-04-20: qty 1

## 2020-04-20 MED ORDER — ROCURONIUM BROMIDE 100 MG/10ML IV SOLN
INTRAVENOUS | Status: DC | PRN
  Administered 2020-04-20: 10 mg via INTRAVENOUS

## 2020-04-20 MED ORDER — BUPIVACAINE-EPINEPHRINE (PF) 0.5% -1:200000 IJ SOLN
INTRAMUSCULAR | Status: DC | PRN
  Administered 2020-04-20: 7 mL

## 2020-04-20 MED ORDER — METHOCARBAMOL 500 MG PO TABS: 500.0000 mg | ORAL_TABLET | Freq: Four times a day (QID) | ORAL | 0 refills | Status: DC | PRN

## 2020-04-20 MED ORDER — LACTATED RINGERS IV SOLN: INTRAVENOUS | Status: DC | PRN

## 2020-04-20 MED ORDER — ONDANSETRON HCL 4 MG/2ML IJ SOLN
INTRAMUSCULAR | Status: DC | PRN
  Administered 2020-04-20: 4 mg via INTRAVENOUS

## 2020-04-20 MED ORDER — KETAMINE HCL 50 MG/ML IJ SOLN
INTRAMUSCULAR | Status: DC | PRN
  Administered 2020-04-20: 50 mg via INTRAMUSCULAR

## 2020-04-20 MED ORDER — ONDANSETRON HCL 4 MG/2ML IJ SOLN
INTRAMUSCULAR | Status: AC
  Filled 2020-04-20: qty 2

## 2020-04-20 MED ORDER — FAMOTIDINE 20 MG PO TABS
ORAL_TABLET | ORAL | Status: AC
  Administered 2020-04-20: 20 mg via ORAL
  Filled 2020-04-20: qty 1

## 2020-04-20 MED ORDER — DEXAMETHASONE SODIUM PHOSPHATE 10 MG/ML IJ SOLN
INTRAMUSCULAR | Status: AC
  Filled 2020-04-20: qty 1

## 2020-04-20 MED ORDER — LIDOCAINE HCL 4 % MT SOLN
OROMUCOSAL | Status: DC | PRN
  Administered 2020-04-20: 4 mL via TOPICAL

## 2020-04-20 MED ORDER — SUCCINYLCHOLINE CHLORIDE 20 MG/ML IJ SOLN
INTRAMUSCULAR | Status: DC | PRN
  Administered 2020-04-20: 120 mg via INTRAVENOUS

## 2020-04-20 MED ORDER — ACETAMINOPHEN 10 MG/ML IV SOLN
INTRAVENOUS | Status: DC | PRN
  Administered 2020-04-20: 1000 mg via INTRAVENOUS

## 2020-04-20 MED ORDER — LACTATED RINGERS IV SOLN: INTRAVENOUS | Status: DC

## 2020-04-20 MED ORDER — SODIUM CHLORIDE 0.9 % IV SOLN
INTRAVENOUS | Status: DC | PRN
Start: 2020-04-20 — End: 2020-04-20
  Administered 2020-04-20: .1 ug/kg/min via INTRAVENOUS

## 2020-04-20 MED ORDER — OXYCODONE HCL 5 MG PO TABS: 5.0000 mg | ORAL_TABLET | ORAL | 0 refills | Status: AC | PRN

## 2020-04-20 MED ORDER — FENTANYL CITRATE (PF) 100 MCG/2ML IJ SOLN
INTRAMUSCULAR | Status: AC
  Filled 2020-04-20: qty 2

## 2020-04-20 MED ORDER — FENTANYL CITRATE (PF) 100 MCG/2ML IJ SOLN
INTRAMUSCULAR | Status: AC
Start: 2020-04-20 — End: 2020-04-20
  Administered 2020-04-20: 50 ug via INTRAVENOUS
  Filled 2020-04-20: qty 2

## 2020-04-20 MED ORDER — LIDOCAINE HCL (PF) 2 % IJ SOLN
INTRAMUSCULAR | Status: AC
  Filled 2020-04-20: qty 5

## 2020-04-20 MED ORDER — SODIUM CHLORIDE 0.9 % IR SOLN
Status: DC | PRN
  Administered 2020-04-20: 1000 mL

## 2020-04-20 MED ORDER — PROPOFOL 10 MG/ML IV BOLUS
INTRAVENOUS | Status: DC | PRN
  Administered 2020-04-20: 100 mg via INTRAVENOUS

## 2020-04-20 MED ORDER — METHOCARBAMOL 500 MG PO TABS
500.0000 mg | ORAL_TABLET | Freq: Four times a day (QID) | ORAL | Status: DC | PRN
  Administered 2020-04-20: 500 mg via ORAL

## 2020-04-20 MED ORDER — KETAMINE HCL 50 MG/ML IJ SOLN
INTRAMUSCULAR | Status: AC
  Filled 2020-04-20: qty 10

## 2020-04-20 MED ORDER — LABETALOL HCL 5 MG/ML IV SOLN
INTRAVENOUS | Status: AC
  Administered 2020-04-20: 5 mg via INTRAVENOUS
  Filled 2020-04-20: qty 4

## 2020-04-20 MED ORDER — PHENYLEPHRINE HCL (PRESSORS) 10 MG/ML IV SOLN
INTRAVENOUS | Status: DC | PRN
  Administered 2020-04-20: 200 ug via INTRAVENOUS
  Administered 2020-04-20: 100 ug via INTRAVENOUS
  Administered 2020-04-20: 50 ug via INTRAVENOUS

## 2020-04-20 MED ORDER — EPHEDRINE 5 MG/ML INJ
INTRAVENOUS | Status: AC
  Filled 2020-04-20: qty 10

## 2020-04-20 MED ORDER — FENTANYL CITRATE (PF) 100 MCG/2ML IJ SOLN: 25.0000 ug | INTRAMUSCULAR | Status: DC | PRN

## 2020-04-20 MED ORDER — CEFAZOLIN SODIUM-DEXTROSE 2-4 GM/100ML-% IV SOLN
2.0000 g | Freq: Once | INTRAVENOUS | Status: AC
  Administered 2020-04-20: 2 g via INTRAVENOUS

## 2020-04-20 MED ORDER — SUCCINYLCHOLINE CHLORIDE 200 MG/10ML IV SOSY
PREFILLED_SYRINGE | INTRAVENOUS | Status: AC
  Filled 2020-04-20: qty 10

## 2020-04-20 MED ORDER — CHLORHEXIDINE GLUCONATE 0.12 % MT SOLN
OROMUCOSAL | Status: AC
  Administered 2020-04-20: 15 mL via OROMUCOSAL
  Filled 2020-04-20: qty 15

## 2020-04-20 MED ORDER — DEXAMETHASONE SODIUM PHOSPHATE 10 MG/ML IJ SOLN
INTRAMUSCULAR | Status: DC | PRN
  Administered 2020-04-20: 10 mg via INTRAVENOUS

## 2020-04-20 MED ORDER — FENTANYL CITRATE (PF) 250 MCG/5ML IJ SOLN
INTRAMUSCULAR | Status: DC | PRN
Start: 2020-04-20 — End: 2020-04-20
  Administered 2020-04-20: 50 ug via INTRAVENOUS

## 2020-04-20 SURGICAL SUPPLY — 65 items
ADH SKN CLS APL DERMABOND .7 (GAUZE/BANDAGES/DRESSINGS) ×1
AGENT HMST MTR 8 SURGIFLO (HEMOSTASIS) ×1
ALLOGRAFT LORDOTIC CC 7X11X14 (Bone Implant) ×2 IMPLANT
ALLOGRAFT TRIAD LORDOTIC CC (Bone Implant) ×2 IMPLANT
APL PRP STRL LF DISP 70% ISPRP (MISCELLANEOUS) ×1
BASKET BONE COLLECTION (BASKET) IMPLANT
BULB RESERV EVAC DRAIN JP 100C (MISCELLANEOUS) IMPLANT
BUR NEURO DRILL SOFT 3.0X3.8M (BURR) ×3 IMPLANT
CANISTER SUCT 1200ML W/VALVE (MISCELLANEOUS) ×6 IMPLANT
CHLORAPREP W/TINT 26 (MISCELLANEOUS) ×4 IMPLANT
COUNTER NEEDLE 20/40 LG (NEEDLE) ×3 IMPLANT
COVER LIGHT HANDLE STERIS (MISCELLANEOUS) ×6 IMPLANT
COVER WAND RF STERILE (DRAPES) ×3 IMPLANT
CUP MEDICINE 2OZ PLAST GRAD ST (MISCELLANEOUS) ×3 IMPLANT
DERMABOND ADVANCED (GAUZE/BANDAGES/DRESSINGS) ×2
DERMABOND ADVANCED .7 DNX12 (GAUZE/BANDAGES/DRESSINGS) ×1 IMPLANT
DRAIN CHANNEL JP 10F RND 20C F (MISCELLANEOUS) IMPLANT
DRAPE C-ARM 42X72 X-RAY (DRAPES) ×6 IMPLANT
DRAPE LAPAROTOMY 77X122 PED (DRAPES) ×3 IMPLANT
DRAPE MICROSCOPE SPINE 48X150 (DRAPES) ×3 IMPLANT
DRAPE SURG 17X11 SM STRL (DRAPES) ×12 IMPLANT
ELECT CAUTERY BLADE TIP 2.5 (TIP) ×3
ELECT REM PT RETURN 9FT ADLT (ELECTROSURGICAL) ×3
ELECTRODE CAUTERY BLDE TIP 2.5 (TIP) ×1 IMPLANT
ELECTRODE REM PT RTRN 9FT ADLT (ELECTROSURGICAL) ×1 IMPLANT
FEE INTRAOP MONITOR IMPULS NCS (MISCELLANEOUS) IMPLANT
FRAME EYE SHIELD (PROTECTIVE WEAR) ×3 IMPLANT
GLOVE BIOGEL PI IND STRL 7.0 (GLOVE) ×1 IMPLANT
GLOVE BIOGEL PI INDICATOR 7.0 (GLOVE) ×2
GLOVE SURG SYN 7.0 (GLOVE) ×6 IMPLANT
GLOVE SURG SYN 7.0 PF PI (GLOVE) ×2 IMPLANT
GLOVE SURG SYN 8.5  E (GLOVE) ×6
GLOVE SURG SYN 8.5 E (GLOVE) ×3 IMPLANT
GLOVE SURG SYN 8.5 PF PI (GLOVE) ×3 IMPLANT
GOWN SRG XL LVL 3 NONREINFORCE (GOWNS) ×1 IMPLANT
GOWN STRL NON-REIN TWL XL LVL3 (GOWNS) ×3
GOWN STRL REUS W/ TWL XL LVL3 (GOWN DISPOSABLE) ×1 IMPLANT
GOWN STRL REUS W/TWL XL LVL3 (GOWN DISPOSABLE) ×3
GRADUATE 1200CC STRL 31836 (MISCELLANEOUS) ×3 IMPLANT
INTRAOP MONITOR FEE IMPULS NCS (MISCELLANEOUS)
INTRAOP MONITOR FEE IMPULSE (MISCELLANEOUS)
KIT TURNOVER KIT A (KITS) ×3 IMPLANT
MARKER SKIN DUAL TIP RULER LAB (MISCELLANEOUS) ×6 IMPLANT
NDL SAFETY ECLIPSE 18X1.5 (NEEDLE) ×1 IMPLANT
NEEDLE HYPO 18GX1.5 SHARP (NEEDLE) ×3
NEEDLE HYPO 22GX1.5 SAFETY (NEEDLE) ×3 IMPLANT
NS IRRIG 1000ML POUR BTL (IV SOLUTION) ×3 IMPLANT
PACK LAMINECTOMY NEURO (CUSTOM PROCEDURE TRAY) ×3 IMPLANT
PAD ARMBOARD 7.5X6 YLW CONV (MISCELLANEOUS) ×3 IMPLANT
PIN CASPAR 14 (PIN) ×1 IMPLANT
PIN CASPAR 14MM (PIN) ×3 IMPLANT
PLATE ANT CERV XTEND 2 LV 28 (Plate) ×2 IMPLANT
SCREW VAR 4.2 XD SELF DRILL 14 (Screw) ×12 IMPLANT
SPOGE SURGIFLO 8M (HEMOSTASIS) ×2
SPONGE KITTNER 5P (MISCELLANEOUS) ×3 IMPLANT
SPONGE SURGIFLO 8M (HEMOSTASIS) ×1 IMPLANT
STAPLER SKIN PROX 35W (STAPLE) IMPLANT
SUT V-LOC 90 ABS DVC 3-0 CL (SUTURE) ×3 IMPLANT
SUT VIC AB 3-0 SH 8-18 (SUTURE) ×3 IMPLANT
SYR 30ML LL (SYRINGE) ×3 IMPLANT
TAPE CLOTH 3X10 WHT NS LF (GAUZE/BANDAGES/DRESSINGS) ×3 IMPLANT
TOWEL OR 17X26 4PK STRL BLUE (TOWEL DISPOSABLE) ×9 IMPLANT
TRAY FOLEY MTR SLVR 16FR STAT (SET/KITS/TRAYS/PACK) IMPLANT
TUBING CONNECTING 10 (TUBING) ×2 IMPLANT
TUBING CONNECTING 10' (TUBING) ×1

## 2020-04-20 NOTE — OR Nursing (Addendum)
Dr. Myer Haff in to see pt @ 1310.  D/C to home ok 4 hrs after case ended per MD.  (1324)  Pt. Ambulatory, tolerating po's, pain satisfactory, and has voided.

## 2020-04-20 NOTE — Op Note (Signed)
Indications: Ms. Jeangilles is a 59 yo female who presented with cervical radiculopathy.  She failed conservative management and elected for surgical intervention due to presence of weakness.  Findings: cervical stenosis  Preoperative Diagnosis: Cervical radiculopathy Postoperative Diagnosis: same   EBL: 25 ml IVF: 600 ml Drains: none Disposition: Extubated and Stable to PACU Complications: none  No foley catheter was placed.   Preoperative Note:   Risks of surgery discussed include: infection, bleeding, stroke, coma, death, paralysis, CSF leak, nerve/spinal cord injury, numbness, tingling, weakness, complex regional pain syndrome, recurrent stenosis and/or disc herniation, vascular injury, development of instability, neck/back pain, need for further surgery, persistent symptoms, development of deformity, and the risks of anesthesia. The patient understood these risks and agreed to proceed.   Procedure:  1) Anterior cervical diskectomy and fusion at C5/6 and C6/7 2) Anterior cervical instrumentation at C5 - 7 using Globus Xtend 3) Placement of structural allograft  4) Use of operative microscope 5) Use of flouroscopy   Procedure: After obtaining informed consent, the patient taken to the operating room, placed in supine position, general anesthesia induced.  The patient had a small shoulder roll placed behind their shoulders.  The patient received preop antibiotics and IV Decadron.  The patient had a neck incision outlined, was prepped and draped in usual sterile fashion. The incision was injected with local anesthetic.   An incision was opened, dissection taken down medial to the carotid artery and jugular vein, lateral to the trachea and esophagus.  The prevertebral fascia identified and a localizing x-ray demonstrated the correct level.  The longus colli were dissected laterally, and self-retaining retractors placed to open the operative field. The microscope was then brought into the  field.  With this complete, distractor pins were placed in the vertebral bodies of C5 and C7. The distractor was placed, and the anuli at C5/6 and C6/7 were opened using a bovie.  Curettes and pituitary rongeurs used to remove the majority of disk, then the drill was used to remove the posterior osteophyte and begin the foraminotomies. The nerve hook was used to elevate the posterior longitudinal ligament, which was then removed with Kerrison rongeurs. The microblunt nerve hook could be passed out the foramen bilaterally at each level.   Meticulous hemostasis was obtained.  Structural allograft was tapped behind the anterior lip of the vertebral body at C5/6 (6 mm) and C6/7 (7 mm).    The caspar distractor was removed, and bone wax used for hemostasis. A separate, 26 mm Globus Xtend plate was chosen.  Two screws placed in each vertebral body, respectively making sure the screws were behind the locking mechanism.  Final AP and lateral radiographs were taken.   With everything in good position, the wound was irrigated copiously with bacitracin-containing solution and meticulous hemostasis obtained.  Wound was closed in 2 layers using interrupted inverted 3-0 Vicryl sutures.  The wound was dressed with dermabond, the head of bed at 30 degrees, taken to recovery room in stable condition.  No new postop neurological deficits were identified.  Sponge and pattie counts were correct at the end of the procedure.    I performed the entire procedure with the assistance of Anabel Halon PA as an Designer, television/film set.  Venetia Night MD

## 2020-04-20 NOTE — Discharge Instructions (Addendum)
Your surgeon has performed an operation on your cervical spine (neck) to relieve pressure on the spinal cord and/or nerves. This involved making an incision in the front of your neck and removing one or more of the discs that support your spine. Next, a small piece of bone, a titanium plate, and screws were used to fuse two or more of the vertebrae (bones) together.  The following are instructions to help in your recovery once you have been discharged from the hospital. Even if you feel well, it is important that you follow these activity guidelines. If you do not let your neck heal properly from the surgery, you can increase the chance of return of your symptoms and other complications.  * Do not take anti-inflammatory medications for 3 months after surgery (naproxen [Aleve], ibuprofen [Advil, Motrin], celecoxib [Celebrex], etc.). These medications can prevent your bones from healing properly.  Activity    No bending, lifting, or twisting ("BLT"). Avoid lifting objects heavier than 10 pounds (gallon milk jug).  Where possible, avoid household activities that involve lifting, bending, reaching, pushing, or pulling such as laundry, vacuuming, grocery shopping, and childcare. Try to arrange for help from friends and family for these activities while your back heals.  Increase physical activity slowly as tolerated.  Taking short walks is encouraged, but avoid strenuous exercise. Do not jog, run, bicycle, lift weights, or participate in any other exercises unless specifically allowed by your doctor.  Talk to your doctor before resuming sexual activity.  You should not drive until cleared by your doctor.  Until released by your doctor, you should not return to work or school.  You should rest at home and let your body heal.   You may shower one day after your surgery.  After showering, lightly dab your incision dry. Do not take a tub bath or go swimming until approved by your doctor at your follow-up  appointment.  If your doctor ordered a cervical collar (neck brace) for you, you should wear it whenever you are out of bed. You may remove it when lying down or sleeping, but you should wear it at all other times. Not all neck surgeries require a cervical collar.  If you smoke, we strongly recommend that you quit.  Smoking has been proven to interfere with normal bone healing and will dramatically reduce the success rate of your surgery. Please contact [facility] (800-QUIT-NOW) and use the resources at www.[facility].com for assistance in stopping smoking.  Surgical Incision   If you have a dressing on your incision, you may remove it two days after your surgery. Keep your incision area clean and dry.  If you have staples or stitches on your incision, you should have a follow up scheduled for removal. If you do not have staples or stitches, you will have steri-strips (small pieces of surgical tape) or Dermabond glue. The steri-strips/glue should begin to peel away within about a week (it is fine if the steri-strips fall off before then). If the strips are still in place one week after your surgery, you may gently remove them.  Diet           You may return to your usual diet. However, you may experience discomfort when swallowing in the first month after your surgery. This is normal. You may find that softer foods are more comfortable for you to swallow. Be sure to stay hydrated.  When to Contact us  You may experience pain in your neck and/or pain between your shoulder  blades. This is normal and should improve in the next few weeks with the help of pain medication, muscle relaxers, and rest. Some patients report that a warm compress on the back of the neck or between the shoulder blades helps.  However, should you experience any of the following, contact us immediately: . New numbness or weakness . Pain that is progressively getting worse, and is not relieved by your pain medication, muscle  relaxers, rest, and warm compresses . Bleeding, redness, swelling, pain, or drainage from surgical incision . Chills or flu-like symptoms . Fever greater than 101.0 F (38.3 C) . Inability to eat, drink fluids, or take medications . Problems with bowel or bladder functions . Difficulty breathing or shortness of breath . Warmth, tenderness, or swelling in your calf Contact Information . During office hours (Monday-Friday 9 am to 5 pm), please call your physician at [phone] and ask for Sharlot Gowda . After hours and weekends, please call [phone] and speak with the answering service, who will contact the attending physician on call.  . For a life-threatening emergency, call 911  AMBULATORY SURGERY  DISCHARGE INSTRUCTIONS   1) The drugs that you were given will stay in your system until tomorrow so for the next 24 hours you should not:  A) Drive an automobile B) Make any legal decisions C) Drink any alcoholic beverage   2) You may resume regular meals tomorrow.  Today it is better to start with liquids and gradually work up to solid foods.  You may eat anything you prefer, but it is better to start with liquids, then soup and crackers, and gradually work up to solid foods.   3) Please notify your doctor immediately if you have any unusual bleeding, trouble breathing, redness and pain at the surgery site, drainage, fever, or pain not relieved by medication.    4) Additional Instructions:        Please contact your physician with any problems or Same Day Surgery at [phone], Monday through Friday 6 am to 4 pm, or  at [facility] number at [phone].

## 2020-04-20 NOTE — H&P (Signed)
I have reviewed and confirmed my history and physical from 04/14/20 with no additions or changes. Plan for C5-7 ACDF.  Risks and benefits reviewed.  Heart sounds normal no MRG. Chest Clear to Auscultation Bilaterally.

## 2020-04-20 NOTE — Progress Notes (Signed)
Pharmacy Antibiotic Note  NANDA Christy Conway is a 59 y.o. female admitted on 04/20/2020 with surgical prophylaxis.  Pharmacy has been consulted for cefazolin dosing.  Plan: Will start cefazolin 2g IV x 1 for surgical prophylaxis  Height: 5\' 4"  (162.6 cm) Weight: 66.7 kg (147 lb) IBW/kg (Calculated) : 54.7  Temp (24hrs), Avg:97.5 F (36.4 C), Min:97.5 F (36.4 C), Max:97.5 F (36.4 C)  Recent Labs  Lab 04/18/20 1005  WBC 7.9  CREATININE 0.79    Estimated Creatinine Clearance: 71.1 mL/min (by C-G formula based on SCr of 0.79 mg/dL).    No Known Allergies   Thank you for allowing pharmacy to be a part of this patient's care.  04/20/20, PharmD, BCPS Clinical Pharmacist 04/20/2020 6:29 AM

## 2020-04-20 NOTE — Anesthesia Preprocedure Evaluation (Signed)
Anesthesia Evaluation  Patient identified by MRN, date of birth, ID band Patient awake    Reviewed: Allergy & Precautions, H&P , NPO status , Patient's Chart, lab work & pertinent test results, reviewed documented beta blocker date and time   History of Anesthesia Complications Negative for: history of anesthetic complications  Airway Mallampati: II  TM Distance: >3 FB Neck ROM: full    Dental  (+) Dental Advidsory Given, Caps, Teeth Intact   Pulmonary neg pulmonary ROS,    Pulmonary exam normal breath sounds clear to auscultation       Cardiovascular Exercise Tolerance: Good negative cardio ROS Normal cardiovascular exam Rhythm:regular Rate:Normal     Neuro/Psych PSYCHIATRIC DISORDERS negative neurological ROS     GI/Hepatic negative GI ROS, Neg liver ROS,   Endo/Other  diabetes (borderline)  Renal/GU negative Renal ROS  negative genitourinary   Musculoskeletal   Abdominal   Peds  Hematology negative hematology ROS (+)   Anesthesia Other Findings Past Medical History: No date: Decreased libido No date: Family history of ovarian cancer No date: Pre-diabetes No date: Prediabetes No date: Status post vaginal hysterectomy No date: Stress fracture of right foot No date: SUI (stress urinary incontinence, female) No date: Surgical menopause 09/10/2014: Vulvar abscess     Comment:  acute and chronic inflammation with granulation tissue   Reproductive/Obstetrics negative OB ROS                             Anesthesia Physical Anesthesia Plan  ASA: II  Anesthesia Plan: General   Post-op Pain Management:    Induction: Intravenous  PONV Risk Score and Plan: 3 and Ondansetron, Dexamethasone and Treatment may vary due to age or medical condition  Airway Management Planned: Oral ETT  Additional Equipment:   Intra-op Plan:   Post-operative Plan: Extubation in OR  Informed Consent:  I have reviewed the patients History and Physical, chart, labs and discussed the procedure including the risks, benefits and alternatives for the proposed anesthesia with the patient or authorized representative who has indicated his/her understanding and acceptance.     Dental Advisory Given  Plan Discussed with: Anesthesiologist, CRNA and Surgeon  Anesthesia Plan Comments:         Anesthesia Quick Evaluation

## 2020-04-20 NOTE — Transfer of Care (Signed)
Immediate Anesthesia Transfer of Care Note  Patient: Christy Conway  Procedure(s) Performed: ANTERIOR CERVICAL DECOMPRESSION/DISCECTOMY FUSION 2 LEVELS C5-7 (N/A )  Patient Location: PACU  Anesthesia Type:General  Level of Consciousness: drowsy  Airway & Oxygen Therapy: Patient Spontanous Breathing and Patient connected to face mask oxygen  Post-op Assessment: Report given to RN and Post -op Vital signs reviewed and stable  Post vital signs: Reviewed and stable  Last Vitals:  Vitals Value Taken Time  BP 165/88 04/20/20 0928  Temp 36.2 C 04/20/20 0928  Pulse 112 04/20/20 0932  Resp 21 04/20/20 0932  SpO2 100 % 04/20/20 0932  Vitals shown include unvalidated device data.  Last Pain:  Vitals:   04/20/20 0616  TempSrc: Tympanic  PainSc: 4       Patients Stated Pain Goal: 3 (04/20/20 6203)  Complications: No complications documented.

## 2020-04-20 NOTE — Discharge Summary (Addendum)
.   Physician Discharge Summary  Patient ID: Christy Conway MRN: [id number] DOB/AGE: 60-May-1962 60 y.o.  Admit date: 04/20/2020 Discharge date: 04/20/2020  Admission Diagnoses: cervical radiculopathy  Discharge Diagnoses:  Active Problems:   * No active hospital problems. *   Discharged Condition: good  Hospital Course: Christy Conway was brought to the operating room for C5-7 anterior cervical discectomy and fusion.  She did well with surgery and was recovered in the recovery area until felt to be stable for discharge.  Consults: None  Significant Diagnostic Studies: radiology: intraoperative xrays show good placement of implants.  Treatments: surgery: C5-7 ACDF  Discharge Exam: Blood pressure (!) 148/83, pulse 91, temperature (!) 97.5 F (36.4 C), temperature source Tympanic, resp. rate 16, height 5\' 4"  (1.626 m), weight 66.7 kg, SpO2 100 %.  AAOx3 CNI Breathing comfortably MAEW - continued L triceps weakness similar to preop  Incision c/d/i Trachea midline  Disposition: home   Allergies as of 04/20/2020   No Known Allergies     Medication List    STOP taking these medications   naproxen sodium 220 MG tablet Commonly known as: ALEVE     TAKE these medications   acetaminophen 500 MG tablet Commonly known as: TYLENOL Take 500-1,000 mg by mouth every 6 (six) hours as needed for moderate pain or headache.   atorvastatin 40 MG tablet Commonly known as: LIPITOR Take 40 mg by mouth at bedtime.   B-12 5000 MCG Caps Take 5,000 mcg by mouth daily.   Calcium 1200 1200-1000 MG-UNIT Chew Chew 1 tablet by mouth daily.   gabapentin 300 MG capsule Commonly known as: NEURONTIN Take 300 mg by mouth 3 (three) times daily.   methocarbamol 500 MG tablet Commonly known as: Robaxin Take 1 tablet (500 mg total) by mouth every 6 (six) hours as needed for muscle spasms.   multivitamin with minerals Tabs tablet Take 1 tablet by mouth daily.   oxyCODONE 5 MG immediate release  tablet Commonly known as: Roxicodone Take 1 tablet (5 mg total) by mouth every 4 (four) hours as needed for up to 5 days for moderate pain.       Follow-up Information    04/22/2020, NP Follow up in 2 week(s).   Specialty: Neurosurgery Why: already scheduled. Contact information: [address] [place] [phone]               Signed: [phone] 04/20/2020, 9:20 AM

## 2020-04-20 NOTE — OR Nursing (Signed)
To X-ray via w/c by Nedra Hai, NT.

## 2020-04-20 NOTE — Anesthesia Procedure Notes (Signed)
Procedure Name: Intubation Date/Time: 04/20/2020 7:34 AM Performed by: Eben Burow, CRNA Pre-anesthesia Checklist: Patient identified, Emergency Drugs available, Suction available and Patient being monitored Patient Re-evaluated:Patient Re-evaluated prior to induction Oxygen Delivery Method: Circle system utilized Preoxygenation: Pre-oxygenation with 100% oxygen Induction Type: IV induction Ventilation: Mask ventilation without difficulty Laryngoscope Size: McGraph and 3 Grade View: Grade I Tube type: Oral Tube size: 7.0 mm Number of attempts: 1 Airway Equipment and Method: Stylet,  Video-laryngoscopy and LTA kit utilized Placement Confirmation: ETT inserted through vocal cords under direct vision,  positive ETCO2 and breath sounds checked- equal and bilateral Secured at: 20 cm Tube secured with: Tape Dental Injury: Teeth and Oropharynx as per pre-operative assessment

## 2020-04-21 ENCOUNTER — Encounter: Payer: Self-pay | Admitting: Neurosurgery

## 2020-04-21 NOTE — Anesthesia Postprocedure Evaluation (Signed)
Anesthesia Post Note  Patient: Christy Conway  Procedure(s) Performed: ANTERIOR CERVICAL DECOMPRESSION/DISCECTOMY FUSION 2 LEVELS C5-7 (N/A )  Patient location during evaluation: PACU Anesthesia Type: General Level of consciousness: awake and alert Pain management: pain level controlled Vital Signs Assessment: post-procedure vital signs reviewed and stable Respiratory status: spontaneous breathing, nonlabored ventilation, respiratory function stable and patient connected to nasal cannula oxygen Cardiovascular status: blood pressure returned to baseline and stable Postop Assessment: no apparent nausea or vomiting Anesthetic complications: no   No complications documented.   Last Vitals:  Vitals:   04/20/20 1219 04/20/20 1317  BP: (!) 155/91 (!) 156/81  Pulse: 88 89  Resp: 16 18  Temp:  (!) 36.3 C  SpO2: 99% 99%    Last Pain:  Vitals:   04/20/20 1317  TempSrc: Temporal  PainSc: 2                  Lenard Simmer

## 2020-07-12 ENCOUNTER — Other Ambulatory Visit: Payer: Self-pay

## 2020-07-15 ENCOUNTER — Other Ambulatory Visit: Payer: Self-pay

## 2020-07-15 ENCOUNTER — Encounter: Payer: Self-pay | Admitting: Nurse Practitioner

## 2020-07-15 ENCOUNTER — Ambulatory Visit (INDEPENDENT_AMBULATORY_CARE_PROVIDER_SITE_OTHER): Payer: BC Managed Care – PPO | Admitting: Nurse Practitioner

## 2020-07-15 VITALS — BP 120/82 | HR 96 | Temp 98.1°F | Ht 64.0 in | Wt 151.0 lb

## 2020-07-15 DIAGNOSIS — Z0000 Encounter for general adult medical examination without abnormal findings: Secondary | ICD-10-CM | POA: Diagnosis not present

## 2020-07-15 DIAGNOSIS — Z8639 Personal history of other endocrine, nutritional and metabolic disease: Secondary | ICD-10-CM

## 2020-07-15 DIAGNOSIS — D582 Other hemoglobinopathies: Secondary | ICD-10-CM

## 2020-07-15 DIAGNOSIS — R7303 Prediabetes: Secondary | ICD-10-CM | POA: Diagnosis not present

## 2020-07-15 DIAGNOSIS — E785 Hyperlipidemia, unspecified: Secondary | ICD-10-CM | POA: Diagnosis not present

## 2020-07-15 DIAGNOSIS — R7401 Elevation of levels of liver transaminase levels: Secondary | ICD-10-CM

## 2020-07-15 DIAGNOSIS — F526 Dyspareunia not due to a substance or known physiological condition: Secondary | ICD-10-CM | POA: Diagnosis not present

## 2020-07-15 DIAGNOSIS — K52832 Lymphocytic colitis: Secondary | ICD-10-CM

## 2020-07-15 LAB — COMPREHENSIVE METABOLIC PANEL
ALT: 42 U/L — ABNORMAL HIGH (ref 0–35)
AST: 29 U/L (ref 0–37)
Albumin: 4.3 g/dL (ref 3.5–5.2)
Alkaline Phosphatase: 104 U/L (ref 39–117)
BUN: 16 mg/dL (ref 6–23)
CO2: 33 mEq/L — ABNORMAL HIGH (ref 19–32)
Calcium: 10.3 mg/dL (ref 8.4–10.5)
Chloride: 102 mEq/L (ref 96–112)
Creatinine, Ser: 0.82 mg/dL (ref 0.40–1.20)
GFR: 78.03 mL/min (ref >60.00)
Glucose, Bld: 107 mg/dL — ABNORMAL HIGH (ref 70–99)
Potassium: 4.7 mEq/L (ref 3.5–5.1)
Sodium: 141 mEq/L (ref 135–145)
Total Bilirubin: 0.7 mg/dL (ref 0.2–1.2)
Total Protein: 7.1 g/dL (ref 6.0–8.3)

## 2020-07-15 LAB — CBC WITH DIFFERENTIAL/PLATELET
Basophils Absolute: 0.1 10*3/uL (ref 0.0–0.1)
Basophils Relative: 1 % (ref 0.0–3.0)
Eosinophils Absolute: 0.2 10*3/uL (ref 0.0–0.7)
Eosinophils Relative: 2.9 % (ref 0.0–5.0)
HCT: 46.4 % — ABNORMAL HIGH (ref 36.0–46.0)
Hemoglobin: 15.4 g/dL — ABNORMAL HIGH (ref 12.0–15.0)
Lymphocytes Relative: 24.9 % (ref 12.0–46.0)
Lymphs Abs: 1.5 10*3/uL (ref 0.7–4.0)
MCHC: 33.3 g/dL (ref 30.0–36.0)
MCV: 96.5 fl (ref 78.0–100.0)
Monocytes Absolute: 0.5 10*3/uL (ref 0.1–1.0)
Monocytes Relative: 7.6 % (ref 3.0–12.0)
Neutro Abs: 3.9 10*3/uL (ref 1.4–7.7)
Neutrophils Relative %: 63.6 % (ref 43.0–77.0)
Platelets: 279 10*3/uL (ref 150.0–400.0)
RBC: 4.81 Mil/uL (ref 3.87–5.11)
RDW: 13.3 % (ref 11.5–15.5)
WBC: 6.2 10*3/uL (ref 4.0–10.5)

## 2020-07-15 LAB — LIPID PANEL
Cholesterol: 173 mg/dL (ref 0–200)
HDL: 59.6 mg/dL (ref >39.00)
LDL Cholesterol: 90 mg/dL (ref 0–99)
NonHDL: 113.49
Total CHOL/HDL Ratio: 3
Triglycerides: 119 mg/dL (ref 0.0–149.0)
VLDL: 23.8 mg/dL (ref 0.0–40.0)

## 2020-07-15 LAB — VITAMIN D 25 HYDROXY (VIT D DEFICIENCY, FRACTURES): VITD: 40.7 ng/mL (ref 30.00–100.00)

## 2020-07-15 LAB — HEMOGLOBIN A1C: Hgb A1c MFr Bld: 6.1 % (ref 4.6–6.5)

## 2020-07-15 LAB — TSH: TSH: 2.14 u[IU]/mL (ref 0.35–4.50)

## 2020-07-15 NOTE — Patient Instructions (Addendum)
Please go to the lab today. Will call with lab results when all reported.   Referral to Dr. Dalbert Garnet at [facility] GYN    A diet rich in lean protein, nonfat low-fat dairy, vegetables, fruits, complex carbohydrates with aerobic exercise  goal of 30 min x 5 times a week and add in twice weekly weight resistance training.  Please make a follow up appt in 4 months for CPE.      Prediabetes Prediabetes is the condition of having a blood sugar (blood glucose) level that is higher than it should be, but not high enough for you to be diagnosed with type 2 diabetes. Having prediabetes puts you at risk for developing type 2 diabetes (type 2 diabetes mellitus). Prediabetes may be called impaired glucose tolerance or impaired fasting glucose. Prediabetes usually does not cause symptoms. Your health care provider can diagnose this condition with blood tests. You may be tested for prediabetes if you are overweight and if you have at least one other risk factor for prediabetes. What is blood glucose, and how is it measured? Blood glucose refers to the amount of glucose in your bloodstream. Glucose comes from eating foods that contain sugars and starches (carbohydrates), which the body breaks down into glucose. Your blood glucose level may be measured in mg/dL (milligrams per deciliter) or mmol/L (millimoles per liter). Your blood glucose may be checked with one or more of the following blood tests:  A fasting blood glucose (FBG) test. You will not be allowed to eat (you will fast) for 8 hours or longer before a blood sample is taken. ? A normal range for FBG is 70-100 mg/dl (5.8-5.2 mmol/L).  An A1c (hemoglobin A1c) blood test. This test provides information about blood glucose control over the previous 2?71months.  An oral glucose tolerance test (OGTT). This test measures your blood glucose at two times: ? After fasting. This is your baseline level. ? Two hours after you drink a beverage that contains  glucose. You may be diagnosed with prediabetes:  If your FBG is 100?125 mg/dL (7.7-8.2 mmol/L).  If your A1c level is 5.7?6.4%.  If your OGTT result is 140?199 mg/dL (4.2-35 mmol/L). These blood tests may be repeated to confirm your diagnosis. How can this condition affect me? The pancreas produces a hormone (insulin) that helps to move glucose from the bloodstream into cells. When cells in the body do not respond properly to insulin that the body makes (insulin resistance), excess glucose builds up in the blood instead of going into cells. As a result, high blood glucose (hyperglycemia) can develop, which can cause many complications. Hyperglycemia is a symptom of prediabetes. Having high blood glucose for a long time is dangerous. Too much glucose in your blood can damage your nerves and blood vessels. Long-term damage can lead to complications from diabetes, which may include:  Heart disease.  Stroke.  Blindness.  Kidney disease.  Depression.  Poor circulation in the feet and legs, which could lead to surgical removal (amputation) in severe cases. What can increase my risk? Risk factors for prediabetes include:  Having a family member with type 2 diabetes.  Being overweight or obese.  Being older than age 52.  Being of American [place], African-American, Hispanic/Latino, or Asian/Pacific Islander descent.  Having an inactive (sedentary) lifestyle.  Having a history of heart disease.  History of gestational diabetes or polycystic ovary syndrome (PCOS), in women.  Having low levels of good cholesterol (HDL-C) or high levels of blood fats (triglycerides).  Having high  blood pressure. What actions can I take to prevent diabetes?      Be physically active. ? Do moderate-intensity physical activity for 30 or more minutes on 5 or more days of the week, or as much as told by your health care provider. This could be brisk walking, biking, or water aerobics. ? Ask your  health care provider what activities are safe for you. A mix of physical activities may be best, such as walking, swimming, cycling, and strength training.  Lose weight as told by your health care provider. ? Losing 5-7% of your body weight can reverse insulin resistance. ? Your health care provider can determine how much weight loss is best for you and can help you lose weight safely.  Follow a healthy meal plan. This includes eating lean proteins, complex carbohydrates, fresh fruits and vegetables, low-fat dairy products, and healthy fats. ? Follow instructions from your health care provider about eating or drinking restrictions. ? Make an appointment to see a diet and nutrition specialist (registered dietitian) to help you create a healthy eating plan that is right for you.  Do not smoke or use any tobacco products, such as cigarettes, chewing tobacco, and e-cigarettes. If you need help quitting, ask your health care provider.  Take over-the-counter and prescription medicines as told by your health care provider. You may be prescribed medicines that help lower the risk of type 2 diabetes.  Keep all follow-up visits as told by your health care provider. This is important. Summary  Prediabetes is the condition of having a blood sugar (blood glucose) level that is higher than it should be, but not high enough for you to be diagnosed with type 2 diabetes.  Having prediabetes puts you at risk for developing type 2 diabetes (type 2 diabetes mellitus).  To help prevent type 2 diabetes, make lifestyle changes such as being physically active and eating a healthy diet. Lose weight as told by your health care provider. This information is not intended to replace advice given to you by your health care provider. Make sure you discuss any questions you have with your health care provider. Document Revised: 12/05/2018 Document Reviewed: 10/04/2015 Elsevier Patient Education  2020 [employer].

## 2020-07-15 NOTE — Progress Notes (Signed)
New Patient Office Visit  Subjective:  Patient ID: JASELYN NAHM, female    DOB: 1961/03/31  Age: 59 y.o. MRN: [id number]  CC:  Chief Complaint  Patient presents with  . 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    She has no complaints except that she notices her urine has an odor in the morning that resolves with the second void of the day.  No dysuria, urgency or frequency.  She does report pain with intercourse in  one localized area in the vagina. She has not discussed this with her GYN. She last saw GYN at [facility] in May for shiny, leathery spot/discoloration of left breast. She had a negative diagnostic mammogram and US done at [facility] Breast Imaging Dept  01/13/2020. She also saw a Dermatologist with negative biopsy. She thinks her last PAP was 2 years ago, no records visible in Epic.   HLD: Patient presents on atorvastatin 40 mg daily at bedtime. She needs refills. No myalgias.  01/29/2020 with LDL 71, cholesterol 140, HDL 49.6  Pre DM/BMI 25.92: A1c 6.5 x1 with glucose 120 performed 01/29/2020.  She has been diet management. Recent weight gain- 12 lbs and plans to lose wt.    Wt Readings from Last 3 Encounters:  07/15/20 151 lb (68.5 kg)  04/18/20 147 lb (66.7 kg)  07/08/18 143 lb 6.4 oz (65 kg)   B12 supplementation: She presents on high dose B12 supplement- no B12 visible in Epic.   Immunizations: Declines flu, Covid vaccines.  Tetanus 2012 and Zoster 2014- both UTD.    Diet:Trying to at healthy Exercise:Walking  Colonoscopy:09/16/2018  Dexa: She is taking calcium plus Vit D.  Pap Smear: 2 years ago by GYN- records not is Epic  Mammogram: 01/13/2020- due again in one  year   Past Medical History:  Diagnosis Date  . Chicken pox   . Decreased libido   . Family history of ovarian cancer   . Pre-diabetes   . Prediabetes   . Status post vaginal hysterectomy   . Stress fracture of right foot   . SUI (stress urinary incontinence, female)   . Surgical menopause   . 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 ABDOMINAL HYSTERECTOMY  1994   transvaginal- cak  . ANTERIOR CERVICAL DECOMP/DISCECTOMY FUSION N/A 04/20/2020   Procedure: ANTERIOR CERVICAL DECOMPRESSION/DISCECTOMY FUSION 2 LEVELS C5-7;  Surgeon: Venetia Night, MD;  Location: [facility] ORS;  Service: Neurosurgery;  Laterality: N/A;  . BILATERAL SALPINGOOPHORECTOMY     and adhesiolysis  . CESAREAN SECTION  1991 twins  . KNEE SURGERY    . SHOULDER SURGERY Right     Family History  Problem Relation Age of Onset  . Ovarian cancer Mother 32  . Cancer Mother   . Stroke Mother   . Heart disease Maternal Grandmother   . Heart disease Maternal Grandfather   . Cancer Father   . Hypertension Sister   . Colon cancer Neg Hx   . Breast cancer Neg Hx   . Diabetes Neg Hx     Social History   Socioeconomic History  . Marital status: Married  Spouse name: Iantha Fallen  . Number of children: 3  . Years of education: Not on file  . Highest education level: Not on file  Occupational History  . Occupation: retired  Tobacco Use  . Smoking status: Never Smoker  . Smokeless tobacco: Never Used  Vaping Use  . Vaping Use: Never used  Substance and Sexual Activity  . Alcohol use: No    Alcohol/week: 0.0 standard drinks  . Drug use: No  . Sexual activity: Yes    Birth control/protection: Surgical  Other Topics Concern  . Not on file  Social History Narrative  . Not on file   Social Determinants of Health   Financial Resource Strain:   . Difficulty of Paying Living Expenses: Not on file  Food  Insecurity:   . Worried About Programme researcher, broadcasting/film/video in the Last Year: Not on file  . Ran Out of Food in the Last Year: Not on file  Transportation Needs:   . Lack of Transportation (Medical): Not on file  . Lack of Transportation (Non-Medical): Not on file  Physical Activity:   . Days of Exercise per Week: Not on file  . Minutes of Exercise per Session: Not on file  Stress:   . Feeling of Stress : Not on file  Social Connections:   . Frequency of Communication with Friends and Family: Not on file  . Frequency of Social Gatherings with Friends and Family: Not on file  . Attends Religious Services: Not on file  . Active Member of Clubs or Organizations: Not on file  . Attends Banker Meetings: Not on file  . Marital Status: Not on file  Intimate Partner Violence:   . Fear of Current or Ex-Partner: Not on file  . Emotionally Abused: Not on file  . Physically Abused: Not on file  . Sexually Abused: Not on file    Review of Systems  Constitutional: Negative.   HENT: Negative.   Eyes: Negative.   Respiratory: Negative.   Cardiovascular: Negative.   Gastrointestinal: Negative.   Endocrine: Negative.   Genitourinary: Positive for pelvic pain.  Musculoskeletal: Negative.   Allergic/Immunologic: Negative.   Neurological: Negative.   Hematological: Negative.   Psychiatric/Behavioral: Negative.        No concerns with depression or anxiety.     Objective:   Today's Vitals: BP 120/82 (BP Location: Left Arm, Patient Position: Sitting, Cuff Size: Normal)   Pulse 96   Temp 98.1 F (36.7 C) (Oral)   Ht 5\' 4"  (1.626 m)   Wt 151 lb (68.5 kg)   LMP  (LMP Unknown)   SpO2 98%   BMI 25.92 kg/m   Physical Exam Vitals reviewed.  Constitutional:      Appearance: She is normal weight.  HENT:     Head: Normocephalic.  Eyes:     Conjunctiva/sclera: Conjunctivae normal.     Pupils: Pupils are equal, round, and reactive to light.  Cardiovascular:     Rate and Rhythm:  Normal rate and regular rhythm.     Pulses: Normal pulses.     Heart sounds: Normal heart sounds.  Pulmonary:     Effort: Pulmonary effort is normal.     Breath sounds: Normal breath sounds.  Abdominal:     Palpations: Abdomen is soft.     Tenderness: There is no abdominal tenderness.  Musculoskeletal:        General: Normal range of motion.     Cervical back: Normal range of motion.  Skin:  General: Skin is warm and dry.  Neurological:     General: No focal deficit present.     Mental Status: She is alert and oriented to person, place, and time.  Psychiatric:        Mood and Affect: Mood normal.        Behavior: Behavior normal.     Assessment & Plan:   Problem List Items Addressed This Visit      Other   Pure hypercholesterolemia   Prediabetes   Relevant Orders   TSH (Completed)   Comprehensive metabolic panel (Completed)   Hemoglobin A1c (Completed)   Encounter for medical examination to establish care - Primary   Relevant Orders   CBC with Differential/Platelet (Completed)   Genito-pelvic pain related to vaginal penetration   Relevant Orders   Ambulatory referral to Obstetrics / Gynecology      Outpatient Encounter Medications as of 07/15/2020  Medication Sig  . acetaminophen (TYLENOL) 500 MG tablet Take 500-1,000 mg by mouth every 6 (six) hours as needed for moderate pain or headache.  Marland Kitchen atorvastatin (LIPITOR) 40 MG tablet Take 40 mg by mouth at bedtime.  . Calcium Carbonate-Vit D-Min (CALCIUM 1200) 1200-1000 MG-UNIT CHEW Chew 1 tablet by mouth daily.  . Cyanocobalamin (B-12) 5000 MCG CAPS Take 5,000 mcg by mouth daily.  . Multiple Vitamin (MULTIVITAMIN WITH MINERALS) TABS tablet Take 1 tablet by mouth daily.  . [DISCONTINUED] gabapentin (NEURONTIN) 300 MG capsule Take 300 mg by mouth 3 (three) times daily. (Patient not taking: Reported on 07/15/2020)  . [DISCONTINUED] methocarbamol (ROBAXIN) 500 MG tablet Take 1 tablet (500 mg total) by mouth every 6 (six)  hours as needed for muscle spasms. (Patient not taking: Reported on 07/15/2020)   No facility-administered encounter medications on file as of 07/15/2020.  Please go to the lab today. Will call with lab results when all reported.   Referral to Dr. Dalbert Garnet at [facility] GYN    A diet rich in lean protein, nonfat low-fat dairy, vegetables, fruits, complex carbohydrates with aerobic exercise  goal of 30 min x 5 times a week and add in twice weekly weight resistance training.  Please make a follow up appt in 4 months for CPE.   Follow-up: No follow-ups on file.  This visit occurred during the SARS-CoV-2 public health emergency.  Safety protocols were in place, including screening questions prior to the visit, additional usage of staff PPE, and extensive cleaning of exam room while observing appropriate contact time as indicated for disinfecting solutions.   Amedeo Kinsman, NP

## 2020-07-17 ENCOUNTER — Encounter: Payer: Self-pay | Admitting: Nurse Practitioner

## 2020-08-02 ENCOUNTER — Ambulatory Visit (INDEPENDENT_AMBULATORY_CARE_PROVIDER_SITE_OTHER): Payer: BC Managed Care – PPO

## 2020-08-02 ENCOUNTER — Other Ambulatory Visit: Payer: Self-pay

## 2020-08-02 ENCOUNTER — Ambulatory Visit (INDEPENDENT_AMBULATORY_CARE_PROVIDER_SITE_OTHER): Payer: BC Managed Care – PPO | Admitting: Podiatry

## 2020-08-02 DIAGNOSIS — M722 Plantar fascial fibromatosis: Secondary | ICD-10-CM | POA: Diagnosis not present

## 2020-08-02 MED ORDER — BETAMETHASONE SOD PHOS & ACET 6 (3-3) MG/ML IJ SUSP
12.0000 mg | Freq: Once | INTRAMUSCULAR | Status: AC
  Administered 2020-08-02: 12 mg via INTRAMUSCULAR

## 2020-08-02 MED ORDER — METHYLPREDNISOLONE 4 MG PO TBPK: ORAL_TABLET | ORAL | 0 refills | Status: AC

## 2020-08-02 MED ORDER — MELOXICAM 15 MG PO TABS: 15.0000 mg | ORAL_TABLET | Freq: Every day | ORAL | 1 refills | Status: DC

## 2020-08-02 NOTE — Progress Notes (Signed)
   Subjective: 59 y.o. female for evaluation of heel pain is been going on for approximately 3 weeks now.  Patient states that she was doing some painting at her home and she was up and down a stepladder.  The following day she noticed significant pain and tenderness to the left heel.  She presents for further treatment evaluation.  She has not done anything for treatment currently   Past Medical History:  Diagnosis Date  . Chicken pox   . Decreased libido   . Family history of ovarian cancer   . Pre-diabetes   . Prediabetes   . Status post vaginal hysterectomy   . Stress fracture of right foot   . SUI (stress urinary incontinence, female)   . Surgical menopause   . Vulvar abscess 09/10/2014   acute and chronic inflammation with granulation tissue     Objective: Physical Exam General: The patient is alert and oriented x3 in no acute distress.  Dermatology: Skin is warm, dry and supple bilateral lower extremities. Negative for open lesions or macerations bilateral.   Vascular: Dorsalis Pedis and Posterior Tibial pulses palpable bilateral.  Capillary fill time is immediate to all digits.  Neurological: Epicritic and protective threshold intact bilateral.   Musculoskeletal: Tenderness to palpation to the plantar aspect of the left heel along the plantar fascia. All other joints range of motion within normal limits bilateral. Strength 5/5 in all groups bilateral.   Radiographic exam: Normal osseous mineralization. Joint spaces preserved. No fracture/dislocation/boney destruction. No other soft tissue abnormalities or radiopaque foreign bodies.   Assessment: 1. Plantar fasciitis left foot  Plan of Care:  1. Patient evaluated. Xrays reviewed.   2. Injection of 0.5cc Celestone soluspan injected into the left plantar fascia.  3. Rx for Medrol Dose Pak placed 4. Rx for Meloxicam ordered for patient. 5.  Recommend good supportive shoes and not going barefoot. 6. Instructed patient  regarding therapies and modalities at home to alleviate symptoms.  7. Return to clinic in 4 weeks.     Felecia Shelling, DPM [facility]  Dr. Felecia Shelling, DPM    [address] [place], [place] [address]                Office [phone]  Fax [phone]

## 2020-08-23 ENCOUNTER — Ambulatory Visit: Payer: BC Managed Care – PPO | Admitting: Podiatry

## 2020-09-29 ENCOUNTER — Ambulatory Visit: Payer: BC Managed Care – PPO | Admitting: Physical Therapy

## 2020-10-06 ENCOUNTER — Other Ambulatory Visit: Payer: Self-pay

## 2020-10-06 ENCOUNTER — Encounter: Payer: Self-pay | Admitting: Physical Therapy

## 2020-10-06 ENCOUNTER — Ambulatory Visit: Payer: BC Managed Care – PPO | Attending: Obstetrics and Gynecology | Admitting: Physical Therapy

## 2020-10-06 DIAGNOSIS — M62838 Other muscle spasm: Secondary | ICD-10-CM | POA: Insufficient documentation

## 2020-10-06 DIAGNOSIS — R102 Pelvic and perineal pain: Secondary | ICD-10-CM | POA: Insufficient documentation

## 2020-10-06 DIAGNOSIS — R278 Other lack of coordination: Secondary | ICD-10-CM | POA: Diagnosis present

## 2020-10-06 NOTE — Therapy (Signed)
[facility] [address] [place], [place], [address] Phone: [phone]   Fax:  [phone]  Physical Therapy Evaluation  Patient Details  Name: CHANELL NADEAU MRN: [id number] Date of Birth: 02-May-1961 Referring Provider (PT): Christeen Douglas   Encounter Date: 10/06/2020   PT End of Session - 10/06/20 1255    Visit Number 1    Number of Visits 8    Date for PT Re-Evaluation 12/01/20    PT Start Time 1300    PT Stop Time 1350    PT Time Calculation (min) 50 min    Activity Tolerance Patient tolerated treatment well    Behavior During Therapy [facility] for tasks assessed/performed;Anxious           Past Medical History:  Diagnosis Date  . Chicken pox   . Decreased libido   . Family history of ovarian cancer   . Pre-diabetes   . Prediabetes   . Status post vaginal hysterectomy   . Stress fracture of right foot   . SUI (stress urinary incontinence, female)   . Surgical menopause   . 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 ABDOMINAL HYSTERECTOMY  1994   transvaginal- cak  . ANTERIOR CERVICAL DECOMP/DISCECTOMY FUSION N/A 04/20/2020   Procedure: ANTERIOR CERVICAL DECOMPRESSION/DISCECTOMY FUSION 2 LEVELS C5-7;  Surgeon: Venetia Night, MD;  Location: [facility] ORS;  Service: Neurosurgery;  Laterality: N/A;  . BILATERAL SALPINGOOPHORECTOMY     and adhesiolysis  . CESAREAN SECTION  1991 twins  . KNEE SURGERY    . SHOULDER SURGERY Right     There were no vitals filed for this visit.     [facility] PT Assessment - 10/06/20 1253      Assessment   Medical Diagnosis Vaginismus    Referring Provider (PT) Dalbert Garnet, Toma Copier    Hand Dominance Right    Prior Therapy None for this dx      Balance Screen   Has the patient fallen in the past 6 months No          PELVIC HEALTH PHYSICAL THERAPY EVALUATION  SCREENING Red Flags:  None Have you had any night sweats? Unexplained weight loss? Saddle anesthesia? Unexplained changes in bowel or bladder habits?  Precautions: None  SUBJECTIVE  Chief Complaint: Patient notes she has had trouble with intercourse for awhile. She notes that with last GYN exam she had 10/10 pain. Patient had pain with initial separation of labia but this was greatly increased with insertion of speculum. Patient notes that she has had pain free penetration in the past, but cannot recollect a specific date/time.   Pertinent History:  Falls Negative.  Scoliosis Negative. Pulmonary disease/dysfunction Negative. Surgical history: see above.   Obstetrical History: G2P3 Deliveries: vaginal, c-section Tearing/Episiotomy: low grade Birthing position: back  Gynecological History: Hysterectomy: Yes Abdominal (1994) Endometriosis: Negative Pain with exam: Yes   Urinary History: Incontinence: Positive. Onset: longstanding Triggers: laughing (25%) Amount: Min. Fluid Intake: 32+ oz H20, 1 cup coffee caffeinated, occasional juices/sodas Nocturia: 1-2x/night Frequency of urination: 6x/day/ every 2 hours Pain with urination: Negative Difficulty initiating urination: Negative Intermittent stream: Negative. Frequent UTI: Negative.   Gastrointestinal History: [place] Stool Chart: Type 4 Frequency of BMs: 1x/day Pain with defecation: Negative Straining with defecation: Positive for a little. Incontinence: Negative.   Sexual activity/pain: Pain with intercourse: Positive.   Initial penetration: Yes  Deep thrustingYes   External  stimulation No Able to achieve orgasm No  Location of pain: introitus Current pain:  0/10  Max pain:  10/10 Least pain:  /10 Pain quality: pain quality: tearing Radiating pain: No    Patient assessment of present state: Unsure  Current activities:  Reading, grandkids, cooking/baking  Patient Goals:  Eliminate pain with  penetration   OBJECTIVE  Mental Status Patient is oriented to person, place and time.  Recent memory is intact.  Remote memory is intact.  Attention span and concentration are intact.  Expressive speech is intact.  Patient's fund of knowledge is within normal limits for educational level.  POSTURE/OBSERVATIONS:  Increased length of thoracic kyphosis. Patient sits with legs crossed at femurs.   GAIT: Grossly WNL.  RANGE OF MOTION: deferred 2/2 to time constraints   LEFT RIGHT  Lumbar forward flexion (65):      Lumbar extension (30):     Lumbar lateral flexion (25):     Thoracic and Lumbar rotation (30 degrees):       Hip Flexion (0-125):      Hip IR (0-45):     Hip ER (0-45):     Hip Abduction (0-40):     Hip extension (0-15):       STRENGTH: MMT deferred 2/2 to time constraints  RLE LLE  Hip Flexion    Hip Extension    Hip Abduction     Hip Adduction     Hip ER     Hip IR     Knee Extension    Knee Flexion    Dorsiflexion     Plantarflexion (seated)     ABDOMINAL: deferred 2/2 to time constraints Palpation: Diastasis: Scar mobility: Rib flare:  SPECIAL TESTS: deferred 2/2 to time constraints  PHYSICAL PERFORMANCE MEASURES: STS: WNL   EXTERNAL PELVIC EXAM: deferred 2/2 to time constraints Palpation: Breath coordination: Cued Lengthen: Cued Contraction: Cough:  INTERNAL VAGINAL EXAM: deferred 2/2 to time constraints Introitus Appears:  Skin integrity:  Scar mobility: Strength (PERF):  Symmetry: Palpation: Prolapse:   INTERNAL RECTAL EXAM: not indicated Strength (PERF): Symmetry: Palpation: Prolapse:   OUTCOME MEASURES: FOTO PFDI Pain 13   ASSESSMENT Patient is a 60 year old presenting to clinic with chief complaints of pain with intercourse/penetration. Upon examination, patient demonstrates deficits in PFM coordination, PFM extensibility, IAP management, pelvic pain, and posture as evidenced by "wall" sensation with penetration,  10/10 pain with both insertion and removal of penetrating object, increased length of thoracic kyphosis, occasional SUI. Patient's responses on FOTO outcome measures (PFDI Pain 13) indicate moderate functional limitations/disability/distress. Patient's progress may be limited due to persistence of complaint and menopausal symptoms; however, patient's motivation is advantageous. Patient was able to achieve basic understanding of PFM functions during today's evaluation and responded positively to educational interventions. Patient will benefit from continued skilled therapeutic intervention to address deficits in PFM coordination, PFM extensibility, IAP management, pelvic pain, and posture in order to increase function and improve overall QOL.  EDUCATION Patient educated on prognosis and POC. Patient articulated understanding. Patient will benefit from further education in order to maximize compliance and understanding for long-term therapeutic gains.  TREATMENT Neuromuscular Re-education: Patient educated on primary functions of the pelvic floor including: posture/balance, sexual pleasure, storage and elimination of waste from the body, abdominal cavity closure, and breath coordination.    Objective measurements completed on examination: See above findings.           PT Long Term Goals - 10/06/20 1640  PT LONG TERM GOAL #1   Title Patient will demonstrate independence with HEP in order to maximize therapeutic gains and improve carryover from physical therapy sessions to ADLs in the home and community.    Baseline IE: not initiated    Time 8    Period Weeks    Status New    Target Date 12/01/20      PT LONG TERM GOAL #2   Title Patient will demonstrate independent and coordinated diaphragmatic breathing in supine with a 1:2 breathing pattern for improved down-regulation of the nervous system and improved management of intra-abdominal pressures in order to increase function at home  and in the community.    Baseline IE: not demonstrated    Time 8    Period Weeks    Status New    Target Date 12/01/20      PT LONG TERM GOAL #3   Title Patient will demonstrate improved function as evidenced by a score of 0 on FOTO measure for full participation in activities at home and in the community.    Baseline IE: PFDI Pain 13    Time 8    Period Weeks    Status New    Target Date 12/01/20      PT LONG TERM GOAL #4   Title Patient will decrease worst pain as reported on NPRS by at least 2 points to demonstrate clinically significant reduction in pain in order to restore/improve function and overall QOL.    Baseline IE: 10/10    Time 8    Period Weeks    Status New    Target Date 12/01/20      PT LONG TERM GOAL #5   Title Patient will report being able to return to activities including, but not limited to: partner intimacy and gynecological exam without pain or limitation to indicate complete resolution of the chief complaint and return to prior level of participation at home and in the community.    Baseline IE: not able (10/10 pain)    Time 8    Period Weeks    Status New    Target Date 12/01/20                  Plan - 10/06/20 1256    Clinical Impression Statement Patient is a 60 year old presenting to clinic with chief complaints of pain with intercourse/penetration. Upon examination, patient demonstrates deficits in PFM coordination, PFM extensibility, IAP management, pelvic pain, and posture as evidenced by "wall" sensation with penetration, 10/10 pain with both insertion and removal of penetrating object, increased length of thoracic kyphosis, occasional SUI. Patient's responses on FOTO outcome measures (PFDI Pain 13) indicate moderate functional limitations/disability/distress. Patient's progress may be limited due to persistence of complaint and menopausal symptoms; however, patient's motivation is advantageous. Patient was able to achieve basic understanding  of PFM functions during today's evaluation and responded positively to educational interventions. Patient will benefit from continued skilled therapeutic intervention to address deficits in PFM coordination, PFM extensibility, IAP management, pelvic pain, and posture in order to increase function and improve overall QOL.    Personal Factors and Comorbidities Comorbidity 3+;Fitness;Time since onset of injury/illness/exacerbation;Past/Current Experience;Behavior Pattern    Comorbidities neck pain, hypercholesteremia, DM    Examination-Activity Limitations Continence;Other    Examination-Participation Restrictions Interpersonal Relationship;Community Activity    Stability/Clinical Decision Making Evolving/Moderate complexity    Clinical Decision Making Moderate    Rehab Potential Fair    PT Frequency 1x / week  PT Duration 8 weeks    PT Treatment/Interventions Neuromuscular re-education;Manual techniques;Orthotic Fit/Training;Patient/family education;Therapeutic exercise;Therapeutic activities;Moist Heat;Cryotherapy;Electrical Stimulation;Taping;Scar mobilization    PT Next Visit Plan physical assessment    PT Home Exercise Plan not initiated    Consulted and Agree with Plan of Care Patient           Patient will benefit from skilled therapeutic intervention in order to improve the following deficits and impairments:  Pain,Postural dysfunction,Impaired flexibility,Increased fascial restricitons,Decreased coordination,Decreased activity tolerance,Improper body mechanics,Decreased range of motion,Increased muscle spasms  Visit Diagnosis: Pelvic pain  Other muscle spasm  Other lack of coordination     Problem List Patient Active Problem List   Diagnosis Date Noted  . Prediabetes 07/15/2020  . Encounter for medical examination to establish care 07/15/2020  . 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 Herniation of cervical intervertebral disc with radiculopathy  03/25/2020  . Foraminal stenosis of cervical region 03/25/2020  . Neck pain 03/25/2020  . Pure hypercholesterolemia 05/21/2019  . Type 2 diabetes mellitus with hyperlipidemia (HCC) 05/21/2019  . Lymphocytic colitis 05/18/2019  . Dyspareunia, female 07/04/2017  . Fracture of sesamoid bone of foot, closed 07/24/2015  . Family history of ovarian cancer 06/28/2015  . Decreased libido 06/28/2015  . Status post vaginal hysterectomy 06/28/2015  . Surgical menopause 04/12/2015   Sheria Lang PT, DPT [id number]  10/06/2020, 4:43 PM  [facility] [address] [place], [place], [address] Phone: [phone]   Fax:  [phone]  Name: AMEERAH INSANA MRN: [id number] Date of Birth: Mar 03, 1961

## 2020-10-13 ENCOUNTER — Encounter: Payer: Self-pay | Admitting: Physical Therapy

## 2020-10-13 ENCOUNTER — Ambulatory Visit: Payer: BC Managed Care – PPO | Admitting: Physical Therapy

## 2020-10-13 ENCOUNTER — Other Ambulatory Visit: Payer: Self-pay

## 2020-10-13 DIAGNOSIS — R102 Pelvic and perineal pain: Secondary | ICD-10-CM | POA: Diagnosis not present

## 2020-10-13 DIAGNOSIS — R278 Other lack of coordination: Secondary | ICD-10-CM

## 2020-10-13 DIAGNOSIS — M62838 Other muscle spasm: Secondary | ICD-10-CM

## 2020-10-13 NOTE — Therapy (Signed)
[facility] [address] [place], [place], [address] Phone: [phone]   Fax:  [phone]  Physical Therapy Treatment  Patient Details  Name: Christy Conway MRN: [id number] Date of Birth: September 15, 1960 Referring Provider (PT): Christeen Douglas   Encounter Date: 10/13/2020   PT End of Session - 10/13/20 1001    Visit Number 2    Number of Visits 8    Date for PT Re-Evaluation 12/01/20    PT Start Time 0955    PT Stop Time 1050    PT Time Calculation (min) 55 min    Activity Tolerance Patient tolerated treatment well    Behavior During Therapy [facility] for tasks assessed/performed;Anxious           Past Medical History:  Diagnosis Date  . Chicken pox   . Decreased libido   . Family history of ovarian cancer   . Pre-diabetes   . Prediabetes   . Status post vaginal hysterectomy   . Stress fracture of right foot   . SUI (stress urinary incontinence, female)   . Surgical menopause   . 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 ABDOMINAL HYSTERECTOMY  1994   transvaginal- cak  . ANTERIOR CERVICAL DECOMP/DISCECTOMY FUSION N/A 04/20/2020   Procedure: ANTERIOR CERVICAL DECOMPRESSION/DISCECTOMY FUSION 2 LEVELS C5-7;  Surgeon: Venetia Night, MD;  Location: [facility] ORS;  Service: Neurosurgery;  Laterality: N/A;  . BILATERAL SALPINGOOPHORECTOMY     and adhesiolysis  . CESAREAN SECTION  1991 twins  . KNEE SURGERY    . SHOULDER SURGERY Right     There were no vitals filed for this visit.   Subjective Assessment - 10/13/20 0956    Subjective Patient notes that she has been doing her breathing exercises. Patient has started on Imvexxi and is tolerating it so far. Patient notes that she drinks 64 oz of water/day as opposed to the 32 oz she reported last session. Patient also notes some continued anxiousness around PFPT and the idea of  returning to penetrative sex.    Currently in Pain? No/denies           TREATMENT  Pre-treatment assessment: RANGE OF MOTION:    LEFT RIGHT  Lumbar forward flexion (65):  Limited; fingertips to just below knees    Lumbar extension (30): WNL    Lumbar lateral flexion (25):  WNL WNL  Thoracic and Lumbar rotation (30 degrees):    WNL WNL  Hip Flexion (0-125):   [facility] WFL  Hip IR (0-45):  [facility] WFL  Hip ER (0-45):  [facility] WFL  Hip Abduction (0-40):  [facility] WFL  Hip extension (0-15):  [facility] WFL   SENSATION: Grossly intact to light touch bilateral LEs as determined by testing dermatomes L2-S2 Proprioception and hot/cold testing deferred on this date  STRENGTH: MMT   RLE LLE  Hip Flexion 5 5  Hip Extension 5 5  Hip Abduction  5 5  Hip Adduction  5 5  Hip ER  5 5  Hip IR  5 5  Knee Extension 5 5  Knee Flexion 5 5  Dorsiflexion  5 5  Plantarflexion (seated) 5 5   ABDOMINAL:  Palpation: no TTP Diastasis: none noted  SPECIAL TESTS: SLR (SN 92, -LR 0.29): R: Negative L:  Negative FABER (SN 81): R: Negative L: Negative FADIR (SN 94): R: Negative L: Negative Stork/March (SP 93): R: Negative  L: Negative  EXTERNAL PELVIC EXAM: Patient educated on the purpose of the pelvic exam and articulated understanding; patient consented to the exam verbally. Palpation: no TTP, increased tension at superior portion of ischiocavernosus L>R Breath coordination: present Cued Lengthen: unable to coordinate without spinal and abdominal compensations Cued Contraction: unable to coordinate without glut and hip adductor compensations  Neuromuscular Re-education: Supine hooklying diaphragmatic breathing with VCs and TCs for downregulation of the nervous system and improved management of IAP Supine knee to chest with PFM lengthening, BLE, for improved PFM spasm release Supine double knee to chest with PFM lengthening for improved PFM spasm release Supine butterfly with PFM lengthening, BLE, for improved PFM  tissue length Patient educated extensively on circular model of female sexual response and biopsychosocial factors that impact sexual function. Patient provided with handouts.  Patient educated throughout session on appropriate technique and form using multi-modal cueing, HEP, and activity modification. Patient articulated understanding and returned demonstration.  Patient Response to interventions: Notes gentle stretch; denies increased pain or discomfort.  ASSESSMENT Patient presents to clinic with excellent motivation to participate in therapy. Patient demonstrates deficits in PFM coordination, PFM extensibility, IAP management, pelvic pain, and posture. Patient able to achieve diaphragmatic breath with all PFM stretches during today's session and responded positively to educational interventions. Patient will benefit from continued skilled therapeutic intervention to address remaining deficits in PFM coordination, PFM extensibility, IAP management, pelvic pain, and posture in order to increase function, and improve overall QOL.        PT Long Term Goals - 10/06/20 1640      PT LONG TERM GOAL #1   Title Patient will demonstrate independence with HEP in order to maximize therapeutic gains and improve carryover from physical therapy sessions to ADLs in the home and community.    Baseline IE: not initiated    Time 8    Period Weeks    Status New    Target Date 12/01/20      PT LONG TERM GOAL #2   Title Patient will demonstrate independent and coordinated diaphragmatic breathing in supine with a 1:2 breathing pattern for improved down-regulation of the nervous system and improved management of intra-abdominal pressures in order to increase function at home and in the community.    Baseline IE: not demonstrated    Time 8    Period Weeks    Status New    Target Date 12/01/20      PT LONG TERM GOAL #3   Title Patient will demonstrate improved function as evidenced by a score of 0 on  FOTO measure for full participation in activities at home and in the community.    Baseline IE: PFDI Pain 13    Time 8    Period Weeks    Status New    Target Date 12/01/20      PT LONG TERM GOAL #4   Title Patient will decrease worst pain as reported on NPRS by at least 2 points to demonstrate clinically significant reduction in pain in order to restore/improve function and overall QOL.    Baseline IE: 10/10    Time 8    Period Weeks    Status New    Target Date 12/01/20      PT LONG TERM GOAL #5   Title Patient will report being able to return to activities including, but not limited to: partner intimacy and gynecological exam without pain or limitation to indicate complete resolution of the chief complaint and return to  prior level of participation at home and in the community.    Baseline IE: not able (10/10 pain)    Time 8    Period Weeks    Status New    Target Date 12/01/20                 Plan - 10/13/20 1001    Clinical Impression Statement Patient presents to clinic with excellent motivation to participate in therapy. Patient demonstrates deficits in PFM coordination, PFM extensibility, IAP management, pelvic pain, and posture. Patient able to achieve diaphragmatic breath with all PFM stretches during today's session and responded positively to educational interventions. Patient will benefit from continued skilled therapeutic intervention to address remaining deficits in PFM coordination, PFM extensibility, IAP management, pelvic pain, and posture in order to increase function, and improve overall QOL.    Personal Factors and Comorbidities Comorbidity 3+;Fitness;Time since onset of injury/illness/exacerbation;Past/Current Experience;Behavior Pattern    Comorbidities neck pain, hypercholesteremia, DM    Examination-Activity Limitations Continence;Other    Examination-Participation Restrictions Interpersonal Relationship;Community Activity    Stability/Clinical Decision  Making Evolving/Moderate complexity    Rehab Potential Fair    PT Frequency 1x / week    PT Duration 8 weeks    PT Treatment/Interventions Neuromuscular re-education;Manual techniques;Orthotic Fit/Training;Patient/family education;Therapeutic exercise;Therapeutic activities;Moist Heat;Cryotherapy;Electrical Stimulation;Taping;Scar mobilization    PT Next Visit Plan physical assessment    PT Home Exercise Plan diaphragmatic breathing; PFM stretches level 1    Consulted and Agree with Plan of Care Patient           Patient will benefit from skilled therapeutic intervention in order to improve the following deficits and impairments:  Pain,Postural dysfunction,Impaired flexibility,Increased fascial restricitons,Decreased coordination,Decreased activity tolerance,Improper body mechanics,Decreased range of motion,Increased muscle spasms  Visit Diagnosis: Pelvic pain  Other lack of coordination  Other muscle spasm     Problem List Patient Active Problem List   Diagnosis Date Noted  . Prediabetes 07/15/2020  . Encounter for medical examination to establish care 07/15/2020  . 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 Herniation of cervical intervertebral disc with radiculopathy 03/25/2020  . Foraminal stenosis of cervical region 03/25/2020  . Neck pain 03/25/2020  . Pure hypercholesterolemia 05/21/2019  . Type 2 diabetes mellitus with hyperlipidemia (HCC) 05/21/2019  . Lymphocytic colitis 05/18/2019  . Dyspareunia, female 07/04/2017  . Fracture of sesamoid bone of foot, closed 07/24/2015  . Family history of ovarian cancer 06/28/2015  . Decreased libido 06/28/2015  . Status post vaginal hysterectomy 06/28/2015  . Surgical menopause 04/12/2015   Sheria Lang PT, DPT [phone]  10/13/2020, 12:46 PM   [facility] [address] [place], [place], [address] Phone: [phone]   Fax:  [phone]  Name: Christy Conway MRN: [id number] Date of Birth: May 02, 1961

## 2020-10-17 ENCOUNTER — Ambulatory Visit: Payer: BC Managed Care – PPO | Admitting: Physical Therapy

## 2020-10-20 ENCOUNTER — Encounter: Payer: BC Managed Care – PPO | Admitting: Physical Therapy

## 2020-10-27 ENCOUNTER — Encounter: Payer: Self-pay | Admitting: Physical Therapy

## 2020-10-27 ENCOUNTER — Ambulatory Visit: Payer: BC Managed Care – PPO | Attending: Obstetrics and Gynecology | Admitting: Physical Therapy

## 2020-10-27 ENCOUNTER — Other Ambulatory Visit: Payer: Self-pay

## 2020-10-27 DIAGNOSIS — R102 Pelvic and perineal pain: Secondary | ICD-10-CM | POA: Insufficient documentation

## 2020-10-27 DIAGNOSIS — M62838 Other muscle spasm: Secondary | ICD-10-CM | POA: Insufficient documentation

## 2020-10-27 DIAGNOSIS — R278 Other lack of coordination: Secondary | ICD-10-CM | POA: Insufficient documentation

## 2020-10-27 NOTE — Therapy (Deleted)
[facility] [address] [place], [place], [address] Phone: [phone]   Fax:  [phone]  Physical Therapy Treatment  Patient Details  Name: Christy Conway MRN: [id number] Date of Birth: Dec 16, 1960 Referring Provider (PT): Christy Conway   Encounter Date: 10/27/2020   PT End of Session - 10/27/20 1051    Visit Number 3    Number of Visits 8    Date for PT Re-Evaluation 12/01/20    PT Start Time 1100    PT Stop Time 1155    PT Time Calculation (min) 55 min    Activity Tolerance Patient tolerated treatment well    Behavior During Therapy [name] for tasks assessed/performed           Past Medical History:  Diagnosis Date  . Chicken pox   . Decreased libido   . Family history of ovarian cancer   . Pre-diabetes   . Prediabetes   . Status post vaginal hysterectomy   . Stress fracture of right foot   . SUI (stress urinary incontinence, female)   . Surgical menopause   . 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 ABDOMINAL HYSTERECTOMY  1994   transvaginal- cak  . ANTERIOR CERVICAL DECOMP/DISCECTOMY FUSION N/A 04/20/2020   Procedure: ANTERIOR CERVICAL DECOMPRESSION/DISCECTOMY FUSION 2 LEVELS C5-7;  Surgeon: Venetia Night, MD;  Location: [facility] ORS;  Service: Neurosurgery;  Laterality: N/A;  . BILATERAL SALPINGOOPHORECTOMY     and adhesiolysis  . CESAREAN SECTION  1991 twins  . KNEE SURGERY    . SHOULDER SURGERY Right     There were no vitals filed for this visit.                                   PT Long Term Goals - 10/06/20 1640      PT LONG TERM GOAL #1   Title Patient will demonstrate independence with HEP in order to maximize therapeutic gains and improve carryover from physical therapy sessions to ADLs in the home and community.    Baseline IE: not initiated    Time 8    Period  Weeks    Status New    Target Date 12/01/20      PT LONG TERM GOAL #2   Title Patient will demonstrate independent and coordinated diaphragmatic breathing in supine with a 1:2 breathing pattern for improved down-regulation of the nervous system and improved management of intra-abdominal pressures in order to increase function at home and in the community.    Baseline IE: not demonstrated    Time 8    Period Weeks    Status New    Target Date 12/01/20      PT LONG TERM GOAL #3   Title Patient will demonstrate improved function as evidenced by a score of 0 on FOTO measure for full participation in activities at home and in the community.    Baseline IE: PFDI Pain 13    Time 8    Period Weeks    Status New    Target Date 12/01/20      PT LONG TERM GOAL #4   Title Patient will decrease worst pain as reported on NPRS by at least 2 points to demonstrate clinically significant reduction in pain in order to restore/improve function and overall QOL.  Baseline IE: 10/10    Time 8    Period Weeks    Status New    Target Date 12/01/20      PT LONG TERM GOAL #5   Title Patient will report being able to return to activities including, but not limited to: partner intimacy and gynecological exam without pain or limitation to indicate complete resolution of the chief complaint and return to prior level of participation at home and in the community.    Baseline IE: not able (10/10 pain)    Time 8    Period Weeks    Status New    Target Date 12/01/20                 Plan - 10/27/20 1052    Personal Factors and Comorbidities Comorbidity 3+;Fitness;Time since onset of injury/illness/exacerbation;Past/Current Experience;Behavior Pattern    Comorbidities neck pain, hypercholesteremia, DM    Examination-Activity Limitations Continence;Other    Examination-Participation Restrictions Interpersonal Relationship;Community Activity    Stability/Clinical Decision Making Evolving/Moderate  complexity    Rehab Potential Fair    PT Frequency 1x / week    PT Duration 8 weeks    PT Treatment/Interventions Neuromuscular re-education;Manual techniques;Orthotic Fit/Training;Patient/family education;Therapeutic exercise;Therapeutic activities;Moist Heat;Cryotherapy;Electrical Stimulation;Taping;Scar mobilization    PT Home Exercise Plan diaphragmatic breathing; PFM stretches level 1    Consulted and Agree with Plan of Care Patient           Patient will benefit from skilled therapeutic intervention in order to improve the following deficits and impairments:  Pain,Postural dysfunction,Impaired flexibility,Increased fascial restricitons,Decreased coordination,Decreased activity tolerance,Improper body mechanics,Decreased range of motion,Increased muscle spasms  Visit Diagnosis: Pelvic pain  Other lack of coordination  Other muscle spasm     Problem List Patient Active Problem List   Diagnosis Date Noted  . Prediabetes 07/15/2020  . Encounter for medical examination to establish care 07/15/2020  . 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 Herniation of cervical intervertebral disc with radiculopathy 03/25/2020  . Foraminal stenosis of cervical region 03/25/2020  . Neck pain 03/25/2020  . Pure hypercholesterolemia 05/21/2019  . Type 2 diabetes mellitus with hyperlipidemia (HCC) 05/21/2019  . Lymphocytic colitis 05/18/2019  . Dyspareunia, female 07/04/2017  . Fracture of sesamoid bone of foot, closed 07/24/2015  . Family history of ovarian cancer 06/28/2015  . Decreased libido 06/28/2015  . Status post vaginal hysterectomy 06/28/2015  . Surgical menopause 04/12/2015    Kathryne Eriksson 10/27/2020, 10:52 AM  [facility] [address] [place], [place], [address] Phone: [phone]   Fax:  [phone]  Name: Christy Conway MRN: [id number] Date of Birth: 09-17-1960

## 2020-10-27 NOTE — Therapy (Signed)
[facility] [address] [place], [place], [address] Phone: [phone]   Fax:  [phone]  Physical Therapy Treatment  Patient Details  Name: Christy Conway MRN: [id number] Date of Birth: 12-05-1960 Referring Provider (PT): Christeen Douglas   Encounter Date: 10/27/2020   PT End of Session - 10/27/20 1051    Visit Number 3    Number of Visits 8    Date for PT Re-Evaluation 12/01/20    PT Start Time 1055    PT Stop Time 1115    PT Time Calculation (min) 20 min    Activity Tolerance Patient tolerated treatment well;Other (comment)   Patient deferring further treatment 2/2 to financial burden   Behavior During Therapy [facility] for tasks assessed/performed           Past Medical History:  Diagnosis Date  . Chicken pox   . Decreased libido   . Family history of ovarian cancer   . Pre-diabetes   . Prediabetes   . Status post vaginal hysterectomy   . Stress fracture of right foot   . SUI (stress urinary incontinence, female)   . Surgical menopause   . 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 ABDOMINAL HYSTERECTOMY  1994   transvaginal- cak  . ANTERIOR CERVICAL DECOMP/DISCECTOMY FUSION N/A 04/20/2020   Procedure: ANTERIOR CERVICAL DECOMPRESSION/DISCECTOMY FUSION 2 LEVELS C5-7;  Surgeon: Venetia Night, MD;  Location: [facility] ORS;  Service: Neurosurgery;  Laterality: N/A;  . BILATERAL SALPINGOOPHORECTOMY     and adhesiolysis  . CESAREAN SECTION  1991 twins  . KNEE SURGERY    . SHOULDER SURGERY Right     There were no vitals filed for this visit.   Subjective Assessment - 10/27/20 1054    Subjective Patient notes that her exercises have been going well and she notes they are getting easier. Patient is concerned about financial resources regarding cost of PT and weekly appointments. Patient interested in keeping today's session as  brief as possible to avoid unneccessary expense. Patient notes that she is comfortable to continue workign independently with a home program.    Currently in Pain? No/denies           TREATMENT Neuromuscular Re-education: Patient education on nervous system downregulation strategies and encouraged to manage stress for improved pain modulation. Patient educated on sensate focus as a strategy for maintaining intimacy while retraining the nervous system to mitigate hypervigilance and PFM tension.  Patient educated throughout session on appropriate technique and form using multi-modal cueing, HEP, and activity modification. Patient articulated understanding and returned demonstration.  Patient Response to interventions: Comfortable to follow up PRN depending on finances.  ASSESSMENT Patient presents to clinic with excellent motivation to participate in therapy. Patient demonstrates deficits in PFM coordination, PFM extensibility, IAP management, pelvic pain, and posture. Patient responded well to all educational information provided during today's session and has decided to forego continuing physical therapy 2/2 to finances and readiness/willingness to address chief complaint. Patient may benefit from continued skilled therapeutic intervention when ready to address remaining deficits in PFM coordination, PFM extensibility, IAP management, pelvic pain, and posture in order to increase function, and improve overall QOL.    PT Long Term Goals - 10/06/20 1640      PT LONG TERM GOAL #1   Title Patient will demonstrate independence with HEP in order to maximize therapeutic gains and improve carryover from  physical therapy sessions to ADLs in the home and community.    Baseline IE: not initiated    Time 8    Period Weeks    Status New    Target Date 12/01/20      PT LONG TERM GOAL #2   Title Patient will demonstrate independent and coordinated diaphragmatic breathing in supine with a 1:2  breathing pattern for improved down-regulation of the nervous system and improved management of intra-abdominal pressures in order to increase function at home and in the community.    Baseline IE: not demonstrated    Time 8    Period Weeks    Status New    Target Date 12/01/20      PT LONG TERM GOAL #3   Title Patient will demonstrate improved function as evidenced by a score of 0 on FOTO measure for full participation in activities at home and in the community.    Baseline IE: PFDI Pain 13    Time 8    Period Weeks    Status New    Target Date 12/01/20      PT LONG TERM GOAL #4   Title Patient will decrease worst pain as reported on NPRS by at least 2 points to demonstrate clinically significant reduction in pain in order to restore/improve function and overall QOL.    Baseline IE: 10/10    Time 8    Period Weeks    Status New    Target Date 12/01/20      PT LONG TERM GOAL #5   Title Patient will report being able to return to activities including, but not limited to: partner intimacy and gynecological exam without pain or limitation to indicate complete resolution of the chief complaint and return to prior level of participation at home and in the community.    Baseline IE: not able (10/10 pain)    Time 8    Period Weeks    Status New    Target Date 12/01/20                 Plan - 10/27/20 1052    Clinical Impression Statement Patient presents to clinic with excellent motivation to participate in therapy. Patient demonstrates deficits in PFM coordination, PFM extensibility, IAP management, pelvic pain, and posture. Patient responded well to all educational information provided during today's session and has decided to forego continuing physical therapy 2/2 to finances and readiness/willingness to address chief complaint. Patient may benefit from continued skilled therapeutic intervention when ready to address remaining deficits in PFM coordination, PFM extensibility, IAP  management, pelvic pain, and posture in order to increase function, and improve overall QOL.    Personal Factors and Comorbidities Comorbidity 3+;Fitness;Time since onset of injury/illness/exacerbation;Past/Current Experience;Behavior Pattern    Comorbidities neck pain, hypercholesteremia, DM    Examination-Activity Limitations Continence;Other    Examination-Participation Restrictions Interpersonal Relationship;Community Activity    Stability/Clinical Decision Making Evolving/Moderate complexity    Rehab Potential Fair    PT Frequency 1x / week    PT Duration 8 weeks    PT Treatment/Interventions Neuromuscular re-education;Manual techniques;Orthotic Fit/Training;Patient/family education;Therapeutic exercise;Therapeutic activities;Moist Heat;Cryotherapy;Electrical Stimulation;Taping;Scar mobilization    PT Home Exercise Plan diaphragmatic breathing; PFM stretches level 1    Consulted and Agree with Plan of Care Patient           Patient will benefit from skilled therapeutic intervention in order to improve the following deficits and impairments:  Pain,Postural dysfunction,Impaired flexibility,Increased fascial restricitons,Decreased coordination,Decreased activity tolerance,Improper body mechanics,Decreased range  of motion,Increased muscle spasms  Visit Diagnosis: Pelvic pain  Other lack of coordination  Other muscle spasm     Problem List Patient Active Problem List   Diagnosis Date Noted  . Prediabetes 07/15/2020  . Encounter for medical examination to establish care 07/15/2020  . 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 Herniation of cervical intervertebral disc with radiculopathy 03/25/2020  . Foraminal stenosis of cervical region 03/25/2020  . Neck pain 03/25/2020  . Pure hypercholesterolemia 05/21/2019  . Type 2 diabetes mellitus with hyperlipidemia (HCC) 05/21/2019  . Lymphocytic colitis 05/18/2019  . Dyspareunia, female 07/04/2017  . Fracture  of sesamoid bone of foot, closed 07/24/2015  . Family history of ovarian cancer 06/28/2015  . Decreased libido 06/28/2015  . Status post vaginal hysterectomy 06/28/2015  . Surgical menopause 04/12/2015   Sheria Lang PT, DPT [phone]  10/27/2020, 2:22 PM  [facility] [address] [place], [place], [address] Phone: [phone]   Fax:  [phone]  Name: Christy Conway MRN: [id number] Date of Birth: 1961-05-11

## 2020-11-03 ENCOUNTER — Encounter: Payer: BC Managed Care – PPO | Admitting: Physical Therapy

## 2020-11-08 ENCOUNTER — Other Ambulatory Visit: Payer: Self-pay

## 2020-11-08 ENCOUNTER — Ambulatory Visit: Payer: BC Managed Care – PPO | Admitting: Podiatry

## 2020-11-08 DIAGNOSIS — M722 Plantar fascial fibromatosis: Secondary | ICD-10-CM | POA: Diagnosis not present

## 2020-11-10 ENCOUNTER — Encounter: Payer: BC Managed Care – PPO | Admitting: Physical Therapy

## 2020-11-17 ENCOUNTER — Encounter: Payer: BC Managed Care – PPO | Admitting: Physical Therapy

## 2020-11-22 NOTE — Progress Notes (Signed)
   Subjective: 60 y.o. female for follow-up evaluation of plantar fasciitis to the left foot.  The patient states that she was doing some painting at her home and she was up and down a stepladder which may have initiated the pain.  This was approximately 2 months ago.  Patient states that the injection helped for only a few weeks.  She presents for further treatment and evaluation   Past Medical History:  Diagnosis Date  . Chicken pox   . Decreased libido   . Family history of ovarian cancer   . Pre-diabetes   . Prediabetes   . Status post vaginal hysterectomy   . Stress fracture of right foot   . SUI (stress urinary incontinence, female)   . Surgical menopause   . Vulvar abscess 09/10/2014   acute and chronic inflammation with granulation tissue     Objective: Physical Exam General: The patient is alert and oriented x3 in no acute distress.  Dermatology: Skin is warm, dry and supple bilateral lower extremities. Negative for open lesions or macerations bilateral.   Vascular: Dorsalis Pedis and Posterior Tibial pulses palpable bilateral.  Capillary fill time is immediate to all digits.  Neurological: Epicritic and protective threshold intact bilateral.   Musculoskeletal: Tenderness to palpation to the plantar aspect of the left heel along the plantar fascia. All other joints range of motion within normal limits bilateral. Strength 5/5 in all groups bilateral.    Assessment: 1. Plantar fasciitis left foot  Plan of Care:  1. Patient evaluated.   2.  Today the patient was molded for custom molded orthotics 3.  Continue meloxicam daily 4.  Return to clinic for orthotic pickup and dispensable   Felecia Shelling, DPM [facility]  Dr. Felecia Shelling, DPM    [address] [place], [place] [address]                Office [phone]  Fax [phone]

## 2020-11-24 ENCOUNTER — Encounter: Payer: BC Managed Care – PPO | Admitting: Physical Therapy

## 2020-12-01 ENCOUNTER — Encounter: Payer: BC Managed Care – PPO | Admitting: Physical Therapy

## 2020-12-01 ENCOUNTER — Other Ambulatory Visit: Payer: Self-pay | Admitting: Podiatry

## 2020-12-08 ENCOUNTER — Encounter: Payer: BC Managed Care – PPO | Admitting: Physical Therapy

## 2020-12-15 ENCOUNTER — Encounter: Payer: BC Managed Care – PPO | Admitting: Physical Therapy

## 2021-02-01 ENCOUNTER — Other Ambulatory Visit: Payer: Self-pay | Admitting: Podiatry

## 2021-02-14 ENCOUNTER — Ambulatory Visit: Payer: BC Managed Care – PPO | Admitting: Podiatry

## 2021-02-14 ENCOUNTER — Other Ambulatory Visit: Payer: Self-pay

## 2021-02-14 ENCOUNTER — Encounter: Payer: Self-pay | Admitting: Podiatry

## 2021-02-14 DIAGNOSIS — M722 Plantar fascial fibromatosis: Secondary | ICD-10-CM

## 2021-02-14 NOTE — Progress Notes (Signed)
   Subjective: 60 y.o. female for follow-up evaluation of plantar fasciitis to the left foot.  The patient states that the last injection she received did not help.  She would like to discuss different treatment options besides injections.   Past Medical History:  Diagnosis Date   Chicken pox    Decreased libido    Family history of ovarian cancer    Pre-diabetes    Prediabetes    Status post vaginal hysterectomy    Stress fracture of right foot    SUI (stress urinary incontinence, female)    Surgical menopause    Vulvar abscess 09/10/2014   acute and chronic inflammation with granulation tissue     Objective: Physical Exam General: The patient is alert and oriented x3 in no acute distress.  Dermatology: Skin is warm, dry and supple bilateral lower extremities. Negative for open lesions or macerations bilateral.   Vascular: Dorsalis Pedis and Posterior Tibial pulses palpable bilateral.  Capillary fill time is immediate to all digits.  Neurological: Epicritic and protective threshold intact bilateral.   Musculoskeletal: Tenderness to palpation to the plantar aspect of the left heel along the plantar fascia. All other joints range of motion within normal limits bilateral. Strength 5/5 in all groups bilateral.    Assessment: 1. Plantar fasciitis left foot  Plan of Care:  1. Patient evaluated.   2.  Patient states that her custom molded orthotics are only minimally alleviating.  She also states that she purchased new shoes from [employer] running store which only helped minimally. 3.  Today we discussed additional treatment options.  At this point we are going to place the patient in a cam boot weightbearing as tolerated x4 weeks.  Patient has a cam boot at home 4.  Continue meloxicam 15 mg daily 5.  Patient will call the office in 4 weeks to report on how she is feeling.  If she is feeling better we will initiate physical therapy to rehab the foot back to activity  *Patient's  co-pay is $95 so we will speak over the phone in 1 month to avoid a co-pay   Felecia Shelling, DPM [facility]  Dr. Felecia Shelling, DPM    [address] [place], [place] [address]                Office [phone]  Fax [phone]

## 2021-02-21 ENCOUNTER — Encounter: Payer: Self-pay | Admitting: Podiatry

## 2021-02-21 ENCOUNTER — Ambulatory Visit: Payer: BC Managed Care – PPO | Admitting: Podiatry

## 2021-03-28 ENCOUNTER — Other Ambulatory Visit: Payer: Self-pay | Admitting: Podiatry

## 2021-05-20 ENCOUNTER — Other Ambulatory Visit: Payer: Self-pay | Admitting: Podiatry

## 2022-07-12 IMAGING — RF DG C-ARM 1-60 MIN
1 series · 3 of 3 positions shown · non-contrast
Comparison: None.

CLINICAL DATA: Surgical fusion.

EXAM:
CERVICAL SPINE - 2-3 VIEW; DG C-ARM 1-60 MIN
Radiation exposure index: 0.3471 mGy.

[Series 1: dg x-ray · 0.20mm/px · 3 of 3 slices shown]
[im 1/3]
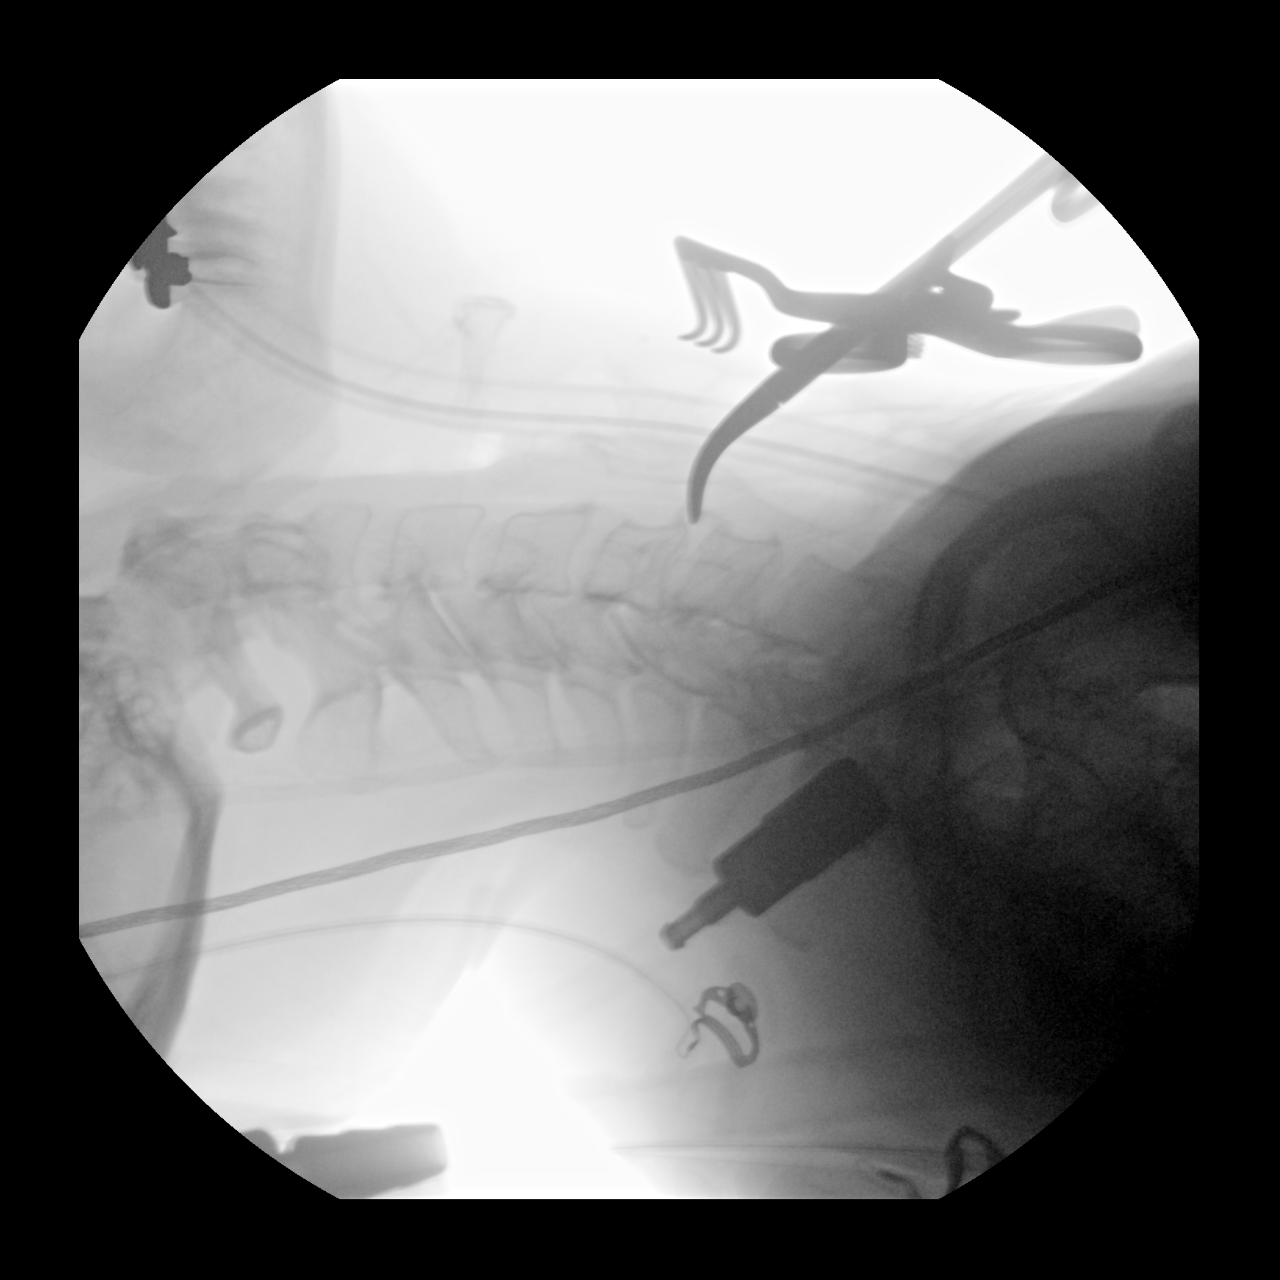
[im 2/3]
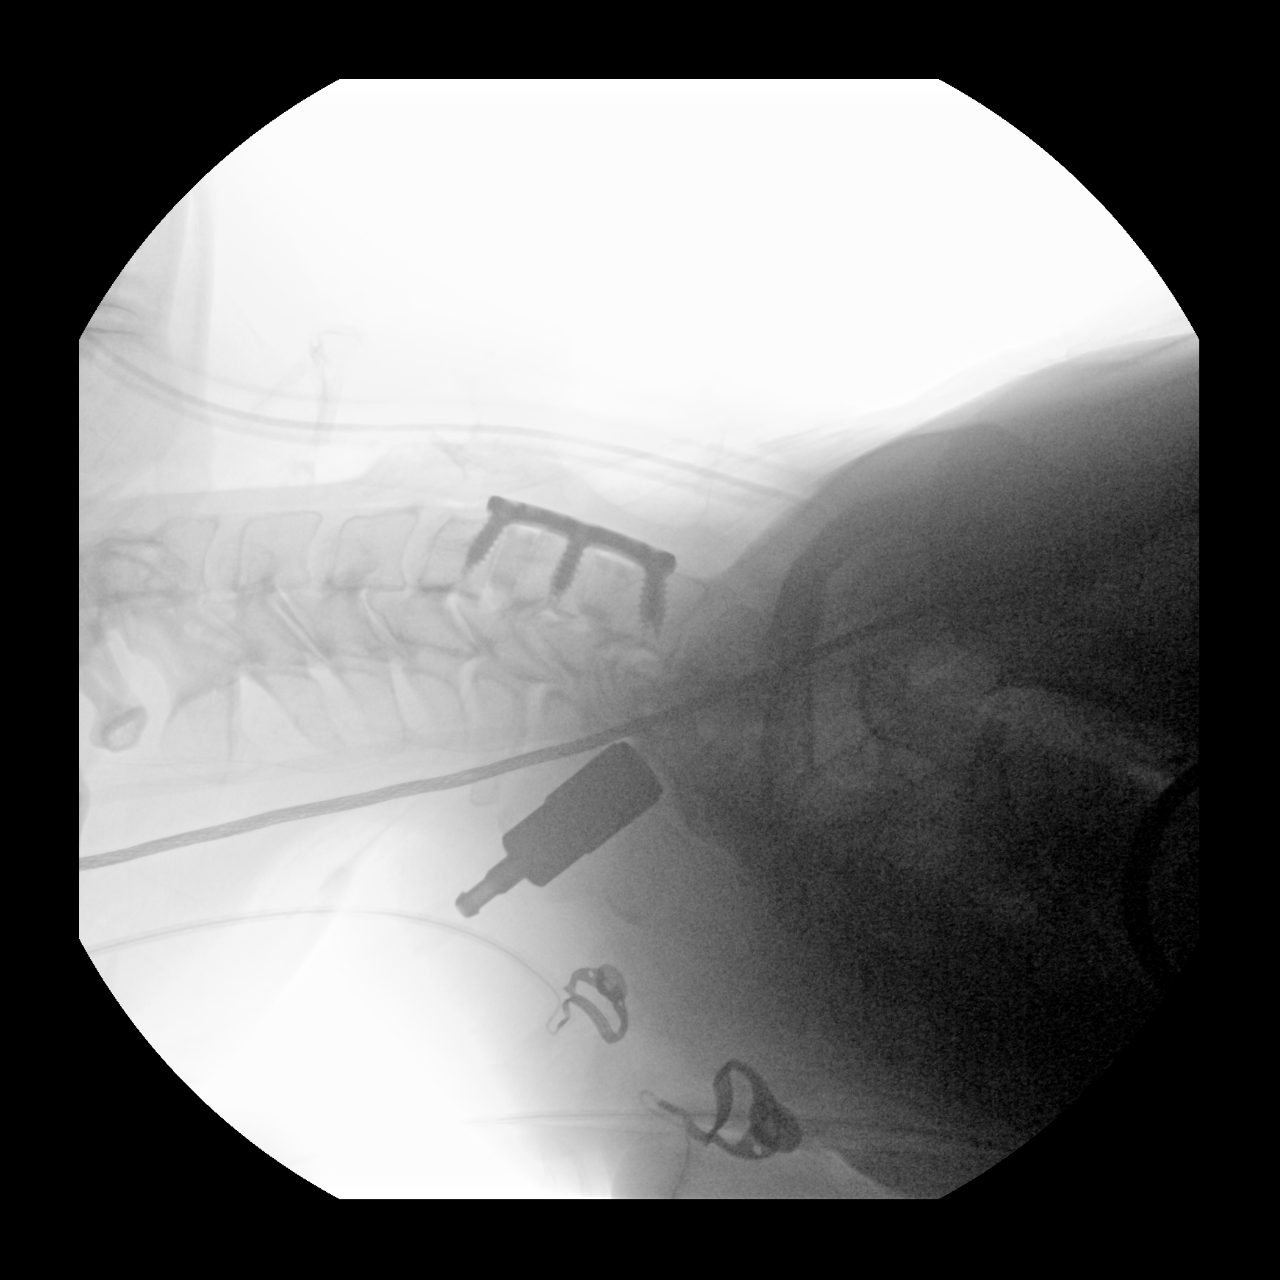
[im 3/3]
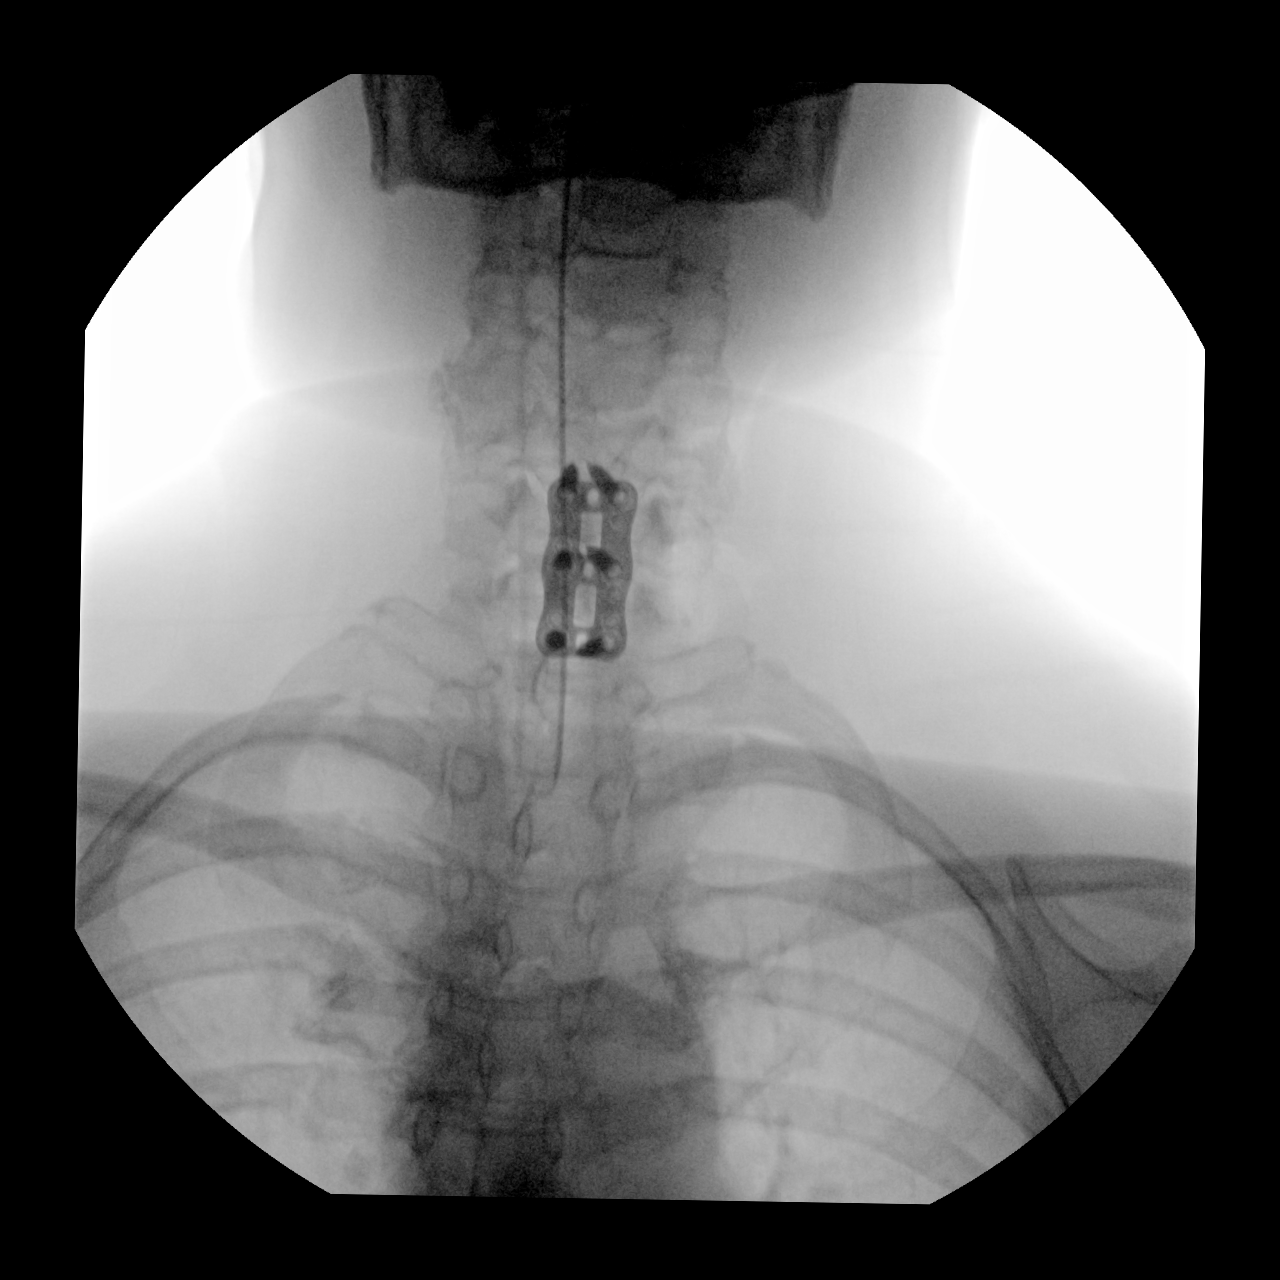

[3 of 3 positions shown; findings below may reference images not displayed]

FINDINGS: Three intraoperative fluoroscopic images were obtained of the
cervical spine. These demonstrate the patient be status post
surgical anterior fusion of C5-6 and C6-7. Good alignment of
vertebral bodies is noted.
IMPRESSION: Status post surgical anterior fusion of C5-6 and C6-7.

## 2022-07-12 IMAGING — RF DG CERVICAL SPINE 2 OR 3 VIEWS
1 series · 3 of 3 positions shown · non-contrast
Comparison: None.

CLINICAL DATA: Surgical fusion.

EXAM:
CERVICAL SPINE - 2-3 VIEW; DG C-ARM 1-60 MIN
Radiation exposure index: 0.3471 mGy.

[Series 1: dg x-ray · 0.20mm/px · 3 of 3 slices shown]
[im 1/3]
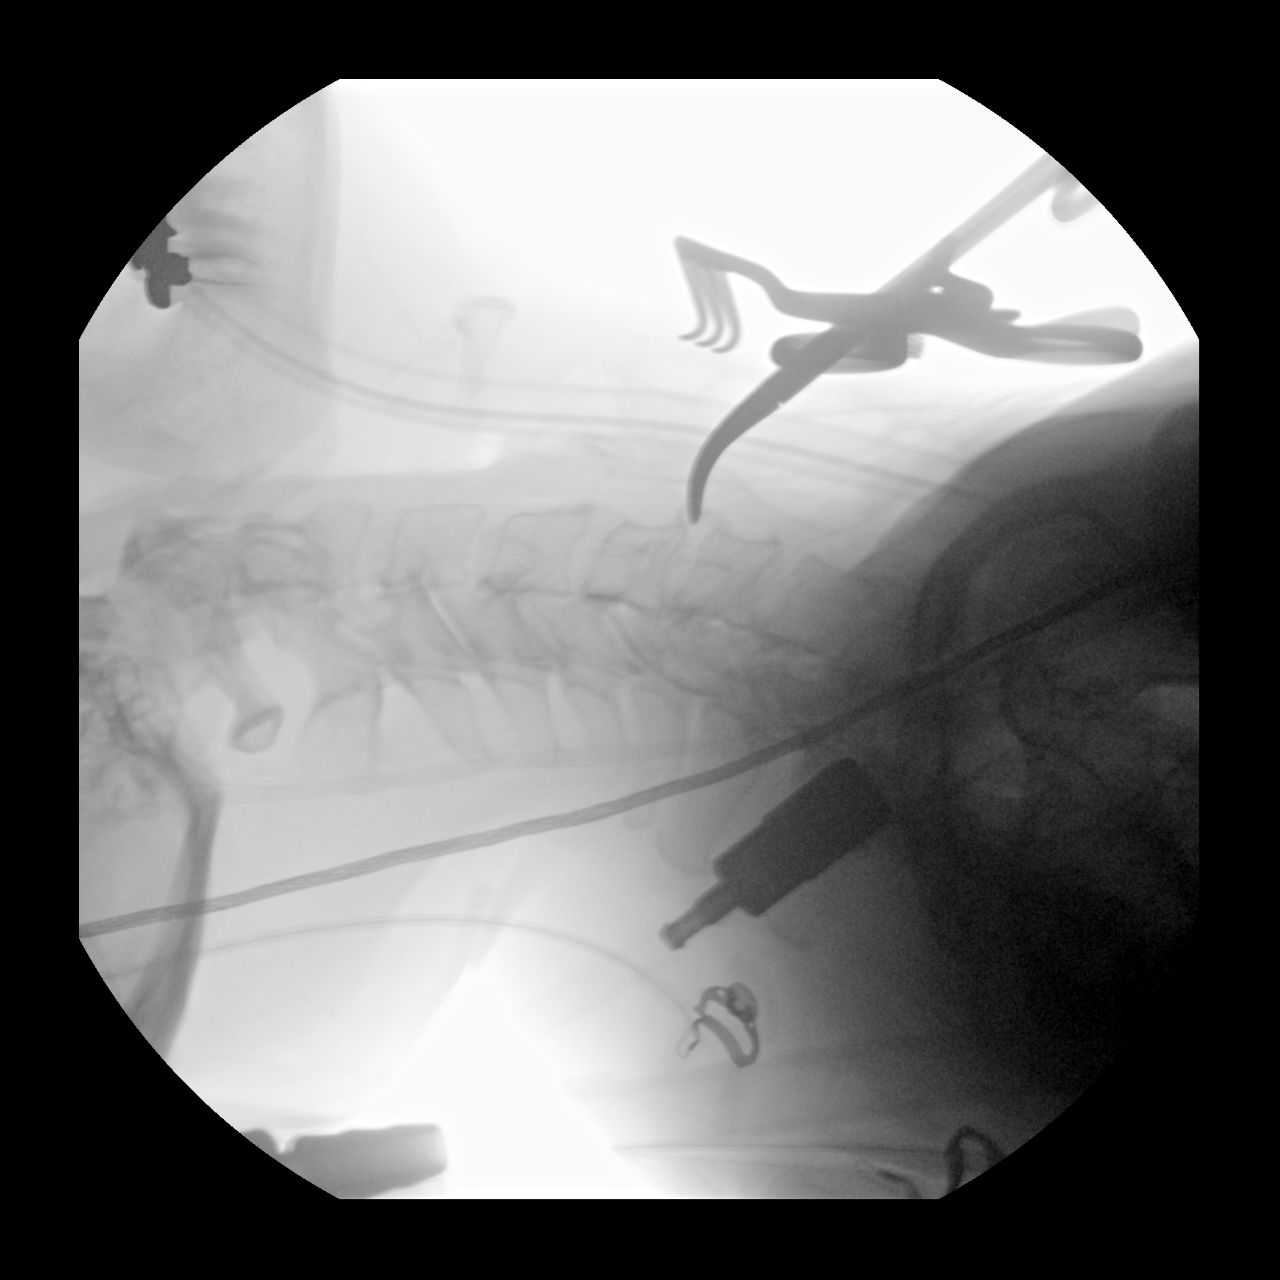
[im 2/3]
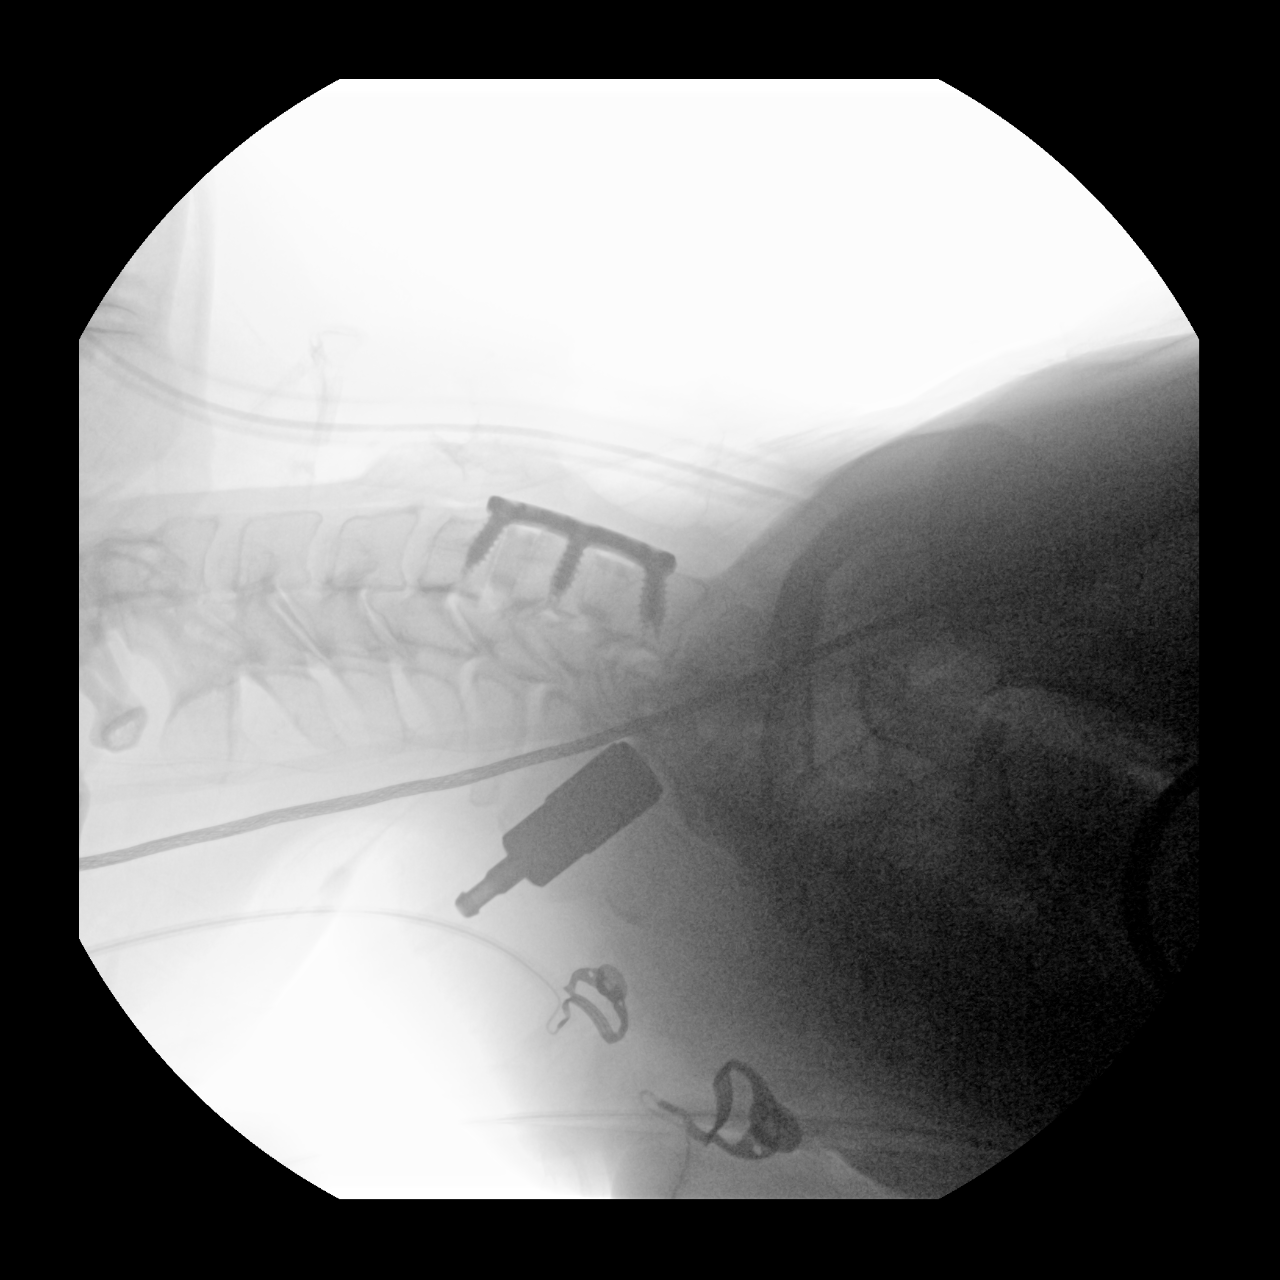
[im 3/3]
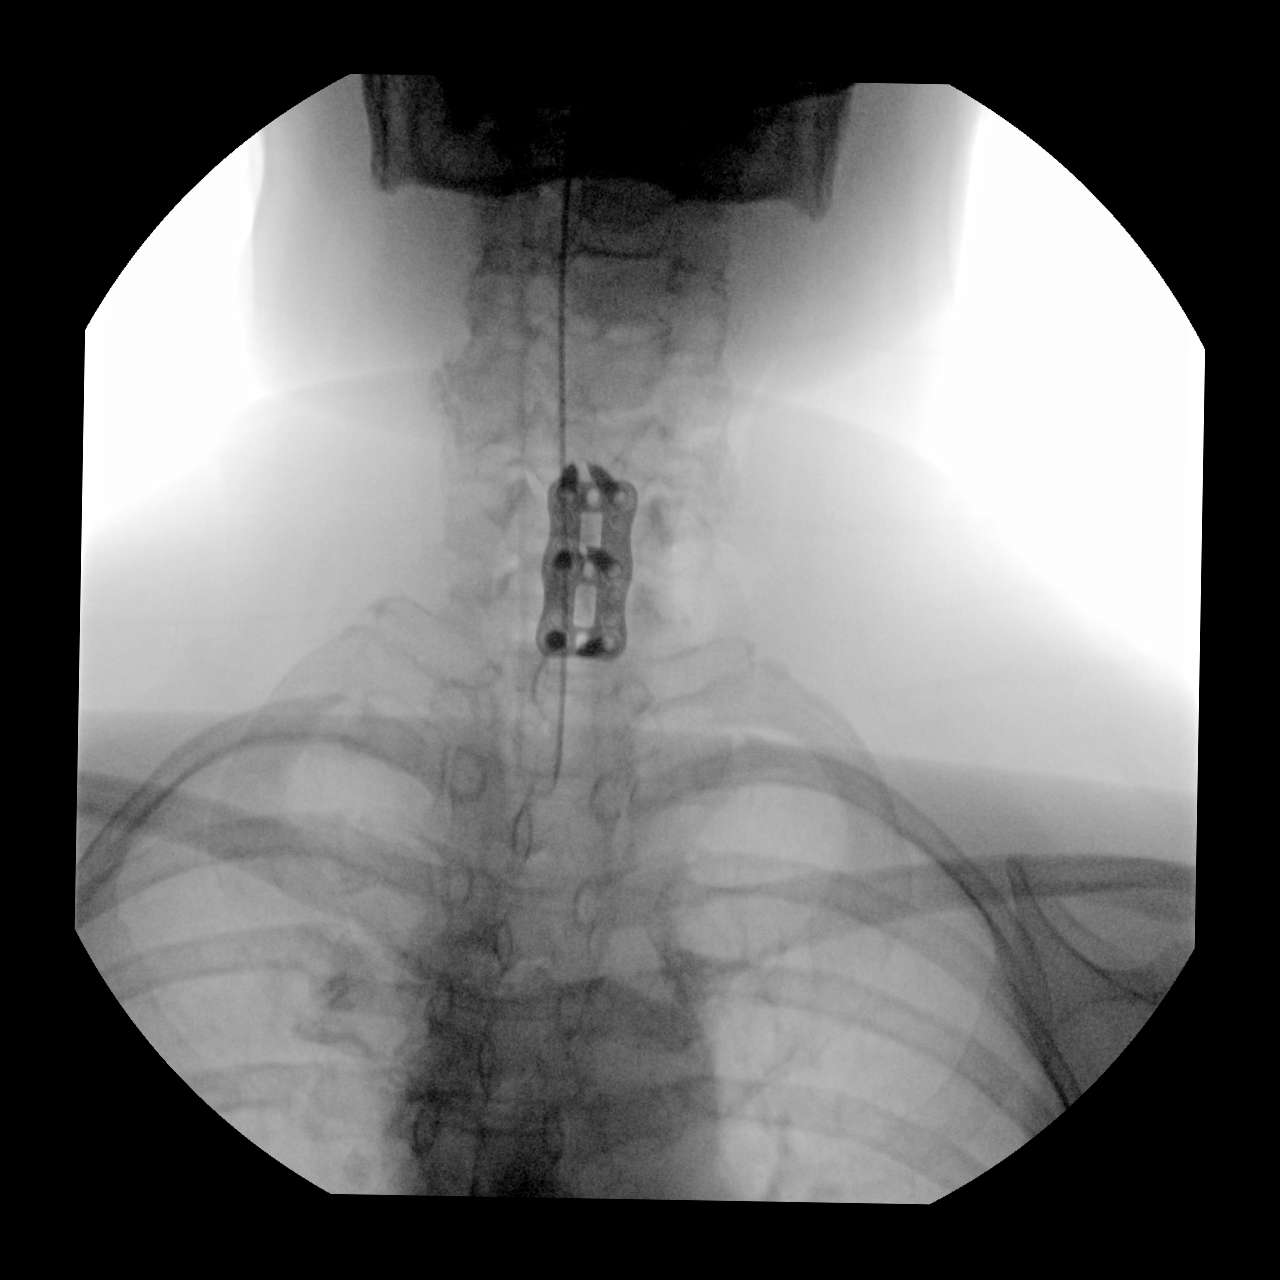

[3 of 3 positions shown; findings below may reference images not displayed]

FINDINGS: Three intraoperative fluoroscopic images were obtained of the
cervical spine. These demonstrate the patient be status post
surgical anterior fusion of C5-6 and C6-7. Good alignment of
vertebral bodies is noted.
IMPRESSION: Status post surgical anterior fusion of C5-6 and C6-7.
